# Patient Record
Sex: Female | Born: 1980 | Race: White | Hispanic: No | Smoking: Current every day smoker
Health system: Southern US, Community
[De-identification: ages and names within clinical notes are randomized; demographics above are authoritative.]

## PROBLEM LIST (undated history)

## (undated) DIAGNOSIS — U071 COVID-19: Secondary | ICD-10-CM

## (undated) DIAGNOSIS — F419 Anxiety disorder, unspecified: Secondary | ICD-10-CM

---

## 2004-08-18 ENCOUNTER — Emergency Department: Payer: Self-pay | Admitting: Emergency Medicine

## 2004-09-05 ENCOUNTER — Emergency Department: Payer: Self-pay | Admitting: Emergency Medicine

## 2004-10-02 ENCOUNTER — Emergency Department: Payer: Self-pay | Admitting: Emergency Medicine

## 2005-01-03 ENCOUNTER — Emergency Department: Payer: Self-pay | Admitting: Emergency Medicine

## 2005-01-31 ENCOUNTER — Emergency Department: Payer: Self-pay | Admitting: Emergency Medicine

## 2005-02-01 ENCOUNTER — Other Ambulatory Visit: Payer: Self-pay

## 2005-02-01 ENCOUNTER — Inpatient Hospital Stay: Payer: Self-pay | Admitting: Internal Medicine

## 2005-02-05 ENCOUNTER — Ambulatory Visit: Payer: Self-pay | Admitting: Endocrinology

## 2005-08-02 ENCOUNTER — Emergency Department: Payer: Self-pay | Admitting: Emergency Medicine

## 2005-08-02 ENCOUNTER — Other Ambulatory Visit: Payer: Self-pay

## 2005-12-13 ENCOUNTER — Emergency Department: Payer: Self-pay | Admitting: Emergency Medicine

## 2006-06-13 ENCOUNTER — Emergency Department: Payer: Self-pay | Admitting: Internal Medicine

## 2006-06-17 ENCOUNTER — Emergency Department: Payer: Self-pay | Admitting: Emergency Medicine

## 2006-06-18 ENCOUNTER — Emergency Department: Payer: Self-pay | Admitting: Emergency Medicine

## 2007-01-05 ENCOUNTER — Emergency Department: Payer: Self-pay | Admitting: Emergency Medicine

## 2007-03-13 ENCOUNTER — Emergency Department: Payer: Self-pay | Admitting: Emergency Medicine

## 2007-09-08 ENCOUNTER — Emergency Department: Payer: Self-pay | Admitting: Emergency Medicine

## 2007-11-16 ENCOUNTER — Emergency Department: Payer: Self-pay

## 2008-02-13 ENCOUNTER — Emergency Department: Payer: Self-pay | Admitting: Emergency Medicine

## 2008-02-16 ENCOUNTER — Ambulatory Visit: Payer: Self-pay | Admitting: Emergency Medicine

## 2008-06-09 ENCOUNTER — Emergency Department: Payer: Self-pay

## 2008-07-14 ENCOUNTER — Emergency Department: Payer: Self-pay | Admitting: Emergency Medicine

## 2008-08-23 ENCOUNTER — Emergency Department: Payer: Self-pay | Admitting: Internal Medicine

## 2008-10-14 ENCOUNTER — Encounter: Payer: Self-pay | Admitting: Obstetrics and Gynecology

## 2008-10-27 ENCOUNTER — Encounter: Payer: Self-pay | Admitting: Obstetrics and Gynecology

## 2008-11-08 ENCOUNTER — Encounter: Payer: Self-pay | Admitting: Obstetrics and Gynecology

## 2008-12-13 ENCOUNTER — Encounter: Payer: Self-pay | Admitting: Maternal & Fetal Medicine

## 2008-12-31 ENCOUNTER — Emergency Department: Payer: Self-pay | Admitting: Emergency Medicine

## 2009-04-15 ENCOUNTER — Inpatient Hospital Stay: Payer: Self-pay

## 2009-04-19 ENCOUNTER — Emergency Department: Payer: Self-pay | Admitting: Unknown Physician Specialty

## 2009-12-04 ENCOUNTER — Emergency Department: Payer: Self-pay | Admitting: Emergency Medicine

## 2009-12-07 ENCOUNTER — Emergency Department: Payer: Self-pay | Admitting: Unknown Physician Specialty

## 2010-02-22 ENCOUNTER — Emergency Department: Payer: Self-pay | Admitting: Emergency Medicine

## 2011-05-29 ENCOUNTER — Emergency Department: Payer: Self-pay | Admitting: *Deleted

## 2011-06-02 ENCOUNTER — Emergency Department (HOSPITAL_COMMUNITY)
Admission: EM | Admit: 2011-06-02 | Discharge: 2011-06-03 | Disposition: A | Discharge status: Home / Self Care | Payer: Medicaid Other | Attending: Emergency Medicine | Admitting: Emergency Medicine

## 2011-06-02 ENCOUNTER — Encounter (HOSPITAL_COMMUNITY): Payer: Self-pay | Admitting: *Deleted

## 2011-06-02 DIAGNOSIS — IMO0002 Reserved for concepts with insufficient information to code with codable children: Secondary | ICD-10-CM | POA: Insufficient documentation

## 2011-06-02 DIAGNOSIS — M25579 Pain in unspecified ankle and joints of unspecified foot: Secondary | ICD-10-CM | POA: Insufficient documentation

## 2011-06-02 DIAGNOSIS — M25572 Pain in left ankle and joints of left foot: Secondary | ICD-10-CM

## 2011-06-02 DIAGNOSIS — M79609 Pain in unspecified limb: Secondary | ICD-10-CM | POA: Insufficient documentation

## 2011-06-02 DIAGNOSIS — M5410 Radiculopathy, site unspecified: Secondary | ICD-10-CM

## 2011-06-02 DIAGNOSIS — M25476 Effusion, unspecified foot: Secondary | ICD-10-CM | POA: Insufficient documentation

## 2011-06-02 DIAGNOSIS — W208XXA Other cause of strike by thrown, projected or falling object, initial encounter: Secondary | ICD-10-CM | POA: Insufficient documentation

## 2011-06-02 DIAGNOSIS — M545 Low back pain, unspecified: Secondary | ICD-10-CM | POA: Insufficient documentation

## 2011-06-02 DIAGNOSIS — M25473 Effusion, unspecified ankle: Secondary | ICD-10-CM | POA: Insufficient documentation

## 2011-06-02 DIAGNOSIS — F17200 Nicotine dependence, unspecified, uncomplicated: Secondary | ICD-10-CM | POA: Insufficient documentation

## 2011-06-02 NOTE — ED Notes (Signed)
She is c/o lower back pain for 3-4 hours and the pain ges down into her buttocks.  She has been moving boxes for the past few days.  No previous history

## 2011-06-03 ENCOUNTER — Emergency Department (HOSPITAL_COMMUNITY): Payer: Medicaid Other

## 2011-06-03 LAB — URINALYSIS, ROUTINE W REFLEX MICROSCOPIC
Ketones, ur: NEGATIVE mg/dL
Nitrite: NEGATIVE
Protein, ur: 30 mg/dL — AB
Urobilinogen, UA: 0.2 mg/dL (ref 0.0–1.0)

## 2011-06-03 MED ORDER — OXYCODONE-ACETAMINOPHEN 5-325 MG PO TABS
1.0000 | ORAL_TABLET | Freq: Once | ORAL | Status: AC
  Administered 2011-06-03: 1 via ORAL
  Filled 2011-06-03: qty 1

## 2011-06-03 MED ORDER — PREDNISONE 10 MG PO TABS: ORAL_TABLET | ORAL | Status: DC

## 2011-06-03 MED ORDER — OXYCODONE-ACETAMINOPHEN 5-325 MG PO TABS: 1.0000 | ORAL_TABLET | ORAL | Status: AC | PRN

## 2011-06-03 NOTE — ED Provider Notes (Signed)
History     CSN: [id number]  Arrival date & time 06/02/11  2332   First MD Initiated Contact with Patient 06/03/11 [phone]      Chief Complaint  Patient presents with  . Back Pain    (Consider location/radiation/quality/duration/timing/severity/associated sxs/prior treatment) Patient is a 31 y.o. female presenting with back pain. The history is provided by the patient.  Back Pain  This is a new problem. The current episode started 12 to 24 hours ago. The problem occurs constantly. The problem has not changed since onset.The pain is associated with lifting heavy objects. The pain is present in the lumbar spine, sacro-iliac joint and gluteal region. The pain radiates to the right thigh. The pain is moderate. The symptoms are aggravated by bending, twisting and certain positions. The pain is the same all the time. Pertinent negatives include no chest pain, no fever, no numbness, no weight loss, no headaches, no abdominal pain, no bowel incontinence, no perianal numbness, no bladder incontinence, no dysuria, no pelvic pain, no leg pain, no paresthesias, no paresis, no tingling and no weakness. She has tried nothing for the symptoms.   Pt is moving and has been lifting heavy boxes. Went to lift a box of pots and pans and developed pain in low back which occasionally but not consistently radiates to R buttock and thigh. Unable to find a comfortable position. Pain is constant. No tx PTA.   Also c/o pain to L ankle; she dropped a mirror on the lateral ankle. Able to flex and bear wt but painful. Denies numbness, weakness, tingling.  History reviewed. No pertinent past medical history.  History reviewed. No pertinent past surgical history.  No family history on file.  History  Substance Use Topics  . Smoking status: Current Everyday Smoker  . Smokeless tobacco: Not on file  . Alcohol Use: No    OB History    Grav Para Term Preterm Abortions TAB SAB Ect Mult Living                  Review  of Systems  Constitutional: Negative for fever and weight loss.  Cardiovascular: Negative for chest pain.  Gastrointestinal: Negative for abdominal pain and bowel incontinence.  Genitourinary: Negative for bladder incontinence, dysuria and pelvic pain.  Musculoskeletal: Positive for back pain.  Neurological: Negative for tingling, weakness, numbness, headaches and paresthesias.  ROS as per HPI  Allergies  Review of patient's allergies indicates no known allergies.  Home Medications   Current Outpatient Rx  Name Route Sig Dispense Refill  . ESCITALOPRAM OXALATE 10 MG PO TABS Oral Take 10 mg by mouth daily.    . IBUPROFEN 200 MG PO TABS Oral Take 200-600 mg by mouth every 6 (six) hours as needed. pain    . LISDEXAMFETAMINE DIMESYLATE 30 MG PO CAPS Oral Take 30 mg by mouth every morning.      BP 121/75  Pulse 88  Temp(Src) 97.5 F (36.4 C) (Oral)  Resp 17  SpO2 100%  LMP 06/02/2011  Physical Exam  Nursing note and vitals reviewed. Constitutional: She appears well-developed and well-nourished. No distress.  HENT:  Head: Normocephalic and atraumatic.  Eyes: EOM are normal. Pupils are equal, round, and reactive to light.  Neck: Normal range of motion. Neck supple.  Cardiovascular: Normal rate, regular rhythm and normal heart sounds.   Pulmonary/Chest: Effort normal and breath sounds normal.  Abdominal: Soft. Bowel sounds are normal. There is no tenderness. There is no rebound and no guarding.  Musculoskeletal:  Spine: No palpable stepoff, crepitus, or gross deformity appreciated. No appreciable spasm of paravertebral muscles. Mildly ttp to midline to mid lumbar spine. No SI or gluteal tenderness.  L ankle: FROM, mild swelling with bruising over lat malleolus, no TTP   Neurological: She is alert.       Strength 5/5 on resisted knee flex/ext, foot dorsi/plantar flex. NVI with sensory grossly intact to lt touch. DP/PT pulses intact. 2+ achilles reflexes.  Skin: Skin is warm  and dry. No rash noted. She is not diaphoretic.  Psychiatric: She has a normal mood and affect.    ED Course  Procedures (including critical care time)  Labs Reviewed  URINALYSIS, ROUTINE W REFLEX MICROSCOPIC - Abnormal; Notable for the following:    Color, Urine RED (*) BIOCHEMICALS MAY BE AFFECTED BY COLOR   APPearance CLOUDY (*)    Hgb urine dipstick LARGE (*)    Protein, ur 30 (*)    Leukocytes, UA MODERATE (*)    All other components within normal limits  URINE MICROSCOPIC-ADD ON - Abnormal; Notable for the following:    Squamous Epithelial / LPF FEW (*)    All other components within normal limits  POCT PREGNANCY, URINE  pt currently menstruating  Dg Lumbar Spine Complete  06/03/2011  *RADIOLOGY REPORT*  Clinical Data: Low back pain  LUMBAR SPINE - COMPLETE 4+ VIEW  Comparison: None.  Findings: Five lumbar-type vertebral bodies.  Normal lumbar lordosis.  No evidence of fracture or dislocation.  Vertebral body heights and intervertebral disc spaces are maintained.  IMPRESSION: Normal lumbar spine radiographs.  Original Report Authenticated By: Charline Bills, M.D.   Dg Ankle Complete Left  06/03/2011  *RADIOLOGY REPORT*  Clinical Data: Mirror fell on left lateral ankle, pain/swelling  LEFT ANKLE COMPLETE - 3+ VIEW  Comparison: None.  Findings: No fracture or dislocation is seen.  The ankle mortise is intact.  The base of the fifth metatarsal is unremarkable.  Mild lateral ankle swelling.  Small plantar calcaneal enthesophyte.  IMPRESSION: No fracture or dislocation is seen.  Mild lateral ankle swelling.  Original Report Authenticated By: Charline Bills, M.D.     1. Radicular low back pain   2. Left ankle pain       MDM  Pt with low back pain. No "red flags" per hx. Neuro exam reassuring. LS XR nl. Suspect likely muscle strain. Will tx with pain meds, steroid. Instructed ice, gentle stretching. Return precautions discussed. Pt agreeable with plan.  Ankle XR  unremarkable.         Grant Fontana, [place] 06/06/11 1724  Grant Fontana, [place] 06/06/11 1725

## 2011-06-03 NOTE — ED Notes (Signed)
Pt states understanding of discharge instructions 

## 2011-06-03 NOTE — Discharge Instructions (Signed)
Your xrays did not show signs of any acute problems. This is likely an irritation of one of the nerves in your back. Please take the prednisone as prescribed along with the pain medication. Use ice or heat to the area if helpful. If you develop numbness or weakness in the legs, lose control of your bowels or bladder, or have any other worrisome symptoms, you need to return to the ER immediately.  Radicular Pain Radicular pain in either the arm or leg is usually from a bulging or herniated disk in the spine. A piece of the herniated disk may press against the nerves as the nerves exit the spine. This causes pain which is felt at the tips of the nerves down the arm or leg. Other causes of radicular pain may include:  Fractures.   Heart disease.   Cancer.   An abnormal and usually degenerative state of the nervous system or nerves (neuropathy).  Diagnosis may require CT or MRI scanning to determine the primary cause.  Nerves that start at the neck (nerve roots) may cause radicular pain in the outer shoulder and arm. It can spread down to the thumb and fingers. The symptoms vary depending on which nerve root has been affected. In most cases radicular pain improves with conservative treatment. Neck problems may require physical therapy, a neck collar, or cervical traction. Treatment may take many weeks, and surgery may be considered if the symptoms do not improve.  Conservative treatment is also recommended for sciatica. Sciatica causes pain to radiate from the lower back or buttock area down the leg into the foot. Often there is a history of back problems. Most patients with sciatica are better after 2 to 4 weeks of rest and other supportive care. Short term bed rest can reduce the disk pressure considerably. Sitting, however, is not a good position since this increases the pressure on the disk. You should avoid bending, lifting, and all other activities which make the problem worse. Traction can be used  in severe cases. Surgery is usually reserved for patients who do not improve within the first months of treatment. Only take over-the-counter or prescription medicines for pain, discomfort, or fever as directed by your caregiver. Narcotics and muscle relaxants may help by relieving more severe pain and spasm and by providing mild sedation. Cold or massage can give significant relief. Spinal manipulation is not recommended. It can increase the degree of disc protrusion. Epidural steroid injections are often effective treatment for radicular pain. These injections deliver medicine to the spinal nerve in the space between the protective covering of the spinal cord and back bones (vertebrae). Your caregiver can give you more information about steroid injections. These injections are most effective when given within two weeks of the onset of pain.  You should see your caregiver for follow up care as recommended. A program for neck and back injury rehabilitation with stretching and strengthening exercises is an important part of management.  SEEK IMMEDIATE MEDICAL CARE IF:  You develop increased pain, weakness, or numbness in your arm or leg.   You develop difficulty with bladder or bowel control.   You develop abdominal pain.  Document Released: 03/22/2004 Document Revised: 02/01/2011 Document Reviewed: 06/07/2008 [facility] Patient Information 2012 [place], [place].

## 2011-06-06 NOTE — ED Provider Notes (Signed)
Medical screening examination/treatment/procedure(s) were performed by non-physician practitioner and as supervising physician I was immediately available for consultation/collaboration.  Gerhard Munch, MD 06/06/11 [phone]

## 2011-09-22 ENCOUNTER — Encounter (HOSPITAL_COMMUNITY): Payer: Self-pay | Admitting: *Deleted

## 2011-09-22 ENCOUNTER — Emergency Department (HOSPITAL_COMMUNITY)
Admission: EM | Admit: 2011-09-22 | Discharge: 2011-09-22 | Discharge status: Against Medical Advice | Payer: Medicaid Other | Attending: Emergency Medicine | Admitting: Emergency Medicine

## 2011-09-22 DIAGNOSIS — R51 Headache: Secondary | ICD-10-CM | POA: Insufficient documentation

## 2011-09-22 HISTORY — DX: Anxiety disorder, unspecified: F41.9

## 2011-09-22 NOTE — ED Notes (Signed)
Patient not in waiting area.

## 2011-09-22 NOTE — ED Notes (Signed)
Pt states that she fell down approx. steps. Pt states she was carrying a box and tripped. Pt states that she has a HA, neck pain. Pt states generalized pain from fall. Pt alert and oriented ambulatory. Pt does have small area on upper lip from fall, pt did hit head but denies LOC>

## 2011-09-22 NOTE — ED Notes (Signed)
Patient not in waiting area at this time

## 2011-09-24 ENCOUNTER — Emergency Department: Payer: Self-pay | Admitting: Emergency Medicine

## 2011-11-26 ENCOUNTER — Emergency Department: Payer: Self-pay | Admitting: Emergency Medicine

## 2011-12-15 ENCOUNTER — Emergency Department: Payer: Self-pay | Admitting: Emergency Medicine

## 2011-12-15 LAB — TSH: Thyroid Stimulating Horm: 9.85 u[IU]/mL — ABNORMAL HIGH

## 2011-12-15 LAB — COMPREHENSIVE METABOLIC PANEL
Anion Gap: 8 (ref 7–16)
BUN: 10 mg/dL (ref 7–18)
Bilirubin,Total: 0.9 mg/dL (ref 0.2–1.0)
Chloride: 110 mmol/L — ABNORMAL HIGH (ref 98–107)
Co2: 24 mmol/L (ref 21–32)
Creatinine: 0.81 mg/dL (ref 0.60–1.30)
EGFR (African American): >60
Osmolality: 282 (ref 275–301)
Potassium: 3.7 mmol/L (ref 3.5–5.1)
Sodium: 142 mmol/L (ref 136–145)
Total Protein: 7.3 g/dL (ref 6.4–8.2)

## 2011-12-15 LAB — URINALYSIS, COMPLETE
Bilirubin,UR: NEGATIVE
Nitrite: NEGATIVE
Ph: 6 (ref 4.5–8.0)
Protein: NEGATIVE

## 2011-12-15 LAB — DRUG SCREEN, URINE
Barbiturates, Ur Screen: NEGATIVE (ref ?–200)
Cannabinoid 50 Ng, Ur ~~LOC~~: POSITIVE (ref ?–50)
Cocaine Metabolite,Ur ~~LOC~~: NEGATIVE (ref ?–300)
MDMA (Ecstasy)Ur Screen: NEGATIVE (ref ?–500)
Phencyclidine (PCP) Ur S: NEGATIVE (ref ?–25)

## 2011-12-15 LAB — CBC
HGB: 12.7 g/dL (ref 12.0–16.0)
RBC: 3.98 10*6/uL (ref 3.80–5.20)

## 2011-12-15 LAB — ETHANOL
Ethanol %: <0.003 % (ref 0.000–0.080)
Ethanol: <3 mg/dL

## 2012-02-06 ENCOUNTER — Emergency Department: Payer: Self-pay | Admitting: Emergency Medicine

## 2012-07-09 ENCOUNTER — Emergency Department: Payer: Self-pay | Admitting: Internal Medicine

## 2012-07-09 LAB — CBC
MCHC: 35 g/dL (ref 32.0–36.0)
Platelet: 321 10*3/uL (ref 150–440)
RBC: 4.28 10*6/uL (ref 3.80–5.20)
WBC: 11.2 10*3/uL — ABNORMAL HIGH (ref 3.6–11.0)

## 2012-07-09 LAB — COMPREHENSIVE METABOLIC PANEL
Alkaline Phosphatase: 70 U/L (ref 50–136)
Co2: 28 mmol/L (ref 21–32)
Creatinine: 0.75 mg/dL (ref 0.60–1.30)
EGFR (African American): >60
EGFR (Non-African Amer.): >60
Potassium: 3.8 mmol/L (ref 3.5–5.1)
SGPT (ALT): 14 U/L (ref 12–78)
Total Protein: 8.2 g/dL (ref 6.4–8.2)

## 2012-07-09 LAB — LIPASE, BLOOD: Lipase: 113 U/L (ref 73–393)

## 2012-07-14 ENCOUNTER — Ambulatory Visit: Payer: Self-pay | Admitting: Surgery

## 2012-07-14 LAB — LIPASE, BLOOD: Lipase: 63 U/L — ABNORMAL LOW (ref 73–393)

## 2012-07-15 LAB — PATHOLOGY REPORT

## 2013-05-22 DIAGNOSIS — F1911 Other psychoactive substance abuse, in remission: Secondary | ICD-10-CM | POA: Insufficient documentation

## 2013-07-07 ENCOUNTER — Emergency Department: Payer: Self-pay | Admitting: Emergency Medicine

## 2013-12-03 ENCOUNTER — Emergency Department: Payer: Self-pay | Admitting: Emergency Medicine

## 2014-03-26 ENCOUNTER — Emergency Department: Payer: Self-pay | Admitting: Emergency Medicine

## 2014-03-26 LAB — CBC
HCT: 41.9 % (ref 35.0–47.0)
HGB: 14.1 g/dL (ref 12.0–16.0)
MCH: 31.6 pg (ref 26.0–34.0)
MCHC: 33.6 g/dL (ref 32.0–36.0)
MCV: 94 fL (ref 80–100)
Platelet: 311 10*3/uL (ref 150–440)
RBC: 4.47 10*6/uL (ref 3.80–5.20)
RDW: 12.6 % (ref 11.5–14.5)
WBC: 12.8 10*3/uL — ABNORMAL HIGH (ref 3.6–11.0)

## 2014-03-26 LAB — BASIC METABOLIC PANEL
ANION GAP: 6 — AB (ref 7–16)
BUN: 12 mg/dL (ref 7–18)
CHLORIDE: 108 mmol/L — AB (ref 98–107)
CO2: 25 mmol/L (ref 21–32)
CREATININE: 0.82 mg/dL (ref 0.60–1.30)
Calcium, Total: 8.9 mg/dL (ref 8.5–10.1)
EGFR (African American): >60
EGFR (Non-African Amer.): >60
Glucose: 97 mg/dL (ref 65–99)
Osmolality: 277 (ref 275–301)
POTASSIUM: 3.6 mmol/L (ref 3.5–5.1)
Sodium: 139 mmol/L (ref 136–145)

## 2014-03-26 LAB — PREGNANCY, URINE: Pregnancy Test, Urine: NEGATIVE m[IU]/mL

## 2014-03-26 LAB — TROPONIN I

## 2014-03-26 LAB — PRO B NATRIURETIC PEPTIDE: B-Type Natriuretic Peptide: 17 pg/mL (ref 0–125)

## 2014-03-27 LAB — TROPONIN I: Troponin-I: <0.02 ng/mL

## 2014-03-27 LAB — HEPATIC FUNCTION PANEL A (ARMC)
Albumin: 3.4 g/dL (ref 3.4–5.0)
Alkaline Phosphatase: 71 U/L (ref 46–116)
BILIRUBIN DIRECT: 0.1 mg/dL (ref 0.0–0.2)
Bilirubin,Total: 0.2 mg/dL (ref 0.2–1.0)
SGOT(AST): 18 U/L (ref 15–37)
SGPT (ALT): 20 U/L (ref 14–63)
Total Protein: 7.3 g/dL (ref 6.4–8.2)

## 2014-03-27 LAB — LIPASE, BLOOD: LIPASE: 97 U/L (ref 73–393)

## 2014-06-18 NOTE — Op Note (Signed)
PATIENT NAME:  [name], Brittany B MR#:  [id number] DATE OF BIRTH:  02-06-1981  DATE OF PROCEDURE:  07/14/2012  PREOPERATIVE DIAGNOSIS: Acute cholecystitis.   POSTOPERATIVE DIAGNOSIS: Symptomatic cholelithiasis.   PROCEDURE PERFORMED: Laparoscopic cholecystectomy.   SURGEON: Ida Roguehristopher Abrar Koone, MD  ESTIMATED BLOOD LOSS: 10 mL.   COMPLICATIONS: None.   SPECIMENS: Gallbladder.   INDICATION FOR SURGERY: Brittany Keller is a pleasant 34 year old female with history of recurrent right upper quadrant pain. She was noted to have mobile gallstones as well as leukocytosis during her most recent episode. We thought she would benefit from a cholecystectomy. She was thus brought to the operating room suite for laparoscopic cholecystectomy.   DETAILS OF PROCEDURE: Brittany Keller was brought to the operating room suite. After informed consent was obtained, she was induced, endotracheal tube was placed, and general anesthesia was administered. Her abdomen was then prepped and draped in standard surgical fashion. A timeout was then performed correctly identifying patient name, operative site and procedure to be performed. A supraumbilical incision was made. This was deepened down to the fascia. The fascia was incised. The peritoneum was entered. Two stay sutures were placed through the fascia.  A Hassan trocar was placed through the fasciotomy. The gallbladder was evaluated. It was noted to not be acutely inflamed. An 11 mm epigastric port was placed and two 5 mm subcostal ports were placed at the mid clavicular line and anterior axillary line. The gallbladder was then lifted up over the dome of the liver. The cystic duct and cystic artery were carefully dissected out. These were clipped 3 times and ligated. The gallbladder was then taken with electrocautery off the dome of the liver. The gallbladder was then taken out with an Endo Catch bag. The gallbladder fossa was examined.  The gallbladder fossa was noted  to be hemostatic. The abdomen was irrigated then with a liter and a half of normal saline. The gallbladder fossa was examined again and noted to be hemostatic. The trocars then were removed under direct visualization. The supraumbilical trocar site was closed using the previously placed stay sutures. The skin was then closed with interrupted 4-0 Monocryl deep dermal sutures. Steri-Strips, Telfa gauze, and Tegaderm were then used to complete the dressing. The patient was then awoken, extubated, and brought to the postanesthesia care unit. There were no immediate complications. Needle, sponge, and instrument counts were correct at the end of the procedure.  ____________________________ Si Raiderhristopher A. Jerra Huckeby, MD cal:sb D: 07/14/2012 12:55:41 ET T: 07/14/2012 14:12:09 ET JOB#: [id number]  cc: Cristal Deerhristopher A. Daschel Roughton, MD, <Dictator> Jarvis NewcomerHRISTOPHER A Brayant Dorr MD ELECTRONICALLY SIGNED 07/17/2012 12:57

## 2015-03-09 IMAGING — US ABDOMEN ULTRASOUND LIMITED
1 series · 14 of 25 positions shown · non-contrast
Comparison: none

REASON FOR EXAM: +Murphy sign
COMMENTS:   Body Site: GB and Fossa, CBD, Head of Pancreas

PROCEDURE:     US  - US ABDOMEN LIMITED SURVEY  - July 09, 2012  [DATE]
RESULT:     Right upper quadrant abdominal ultrasound dated 07/09/2012.

[Series 1: abdomen ultrasound limited · 0.30mm/px · 14 of 48 slices shown]
[im 1/48]
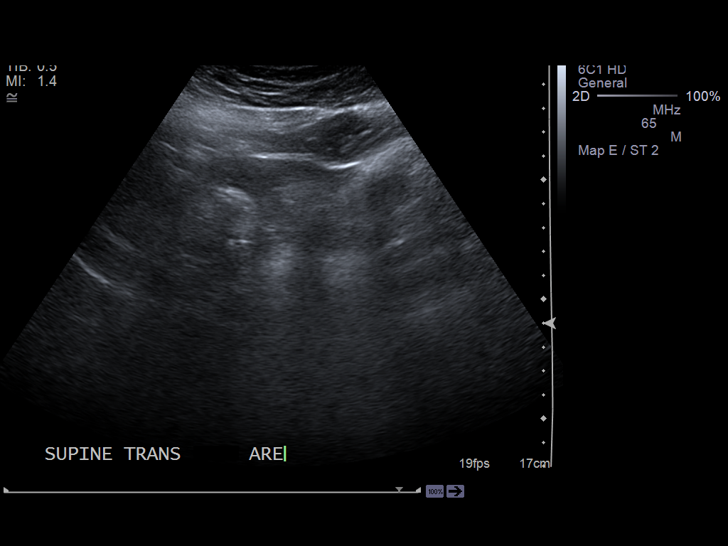
[im 4/48]
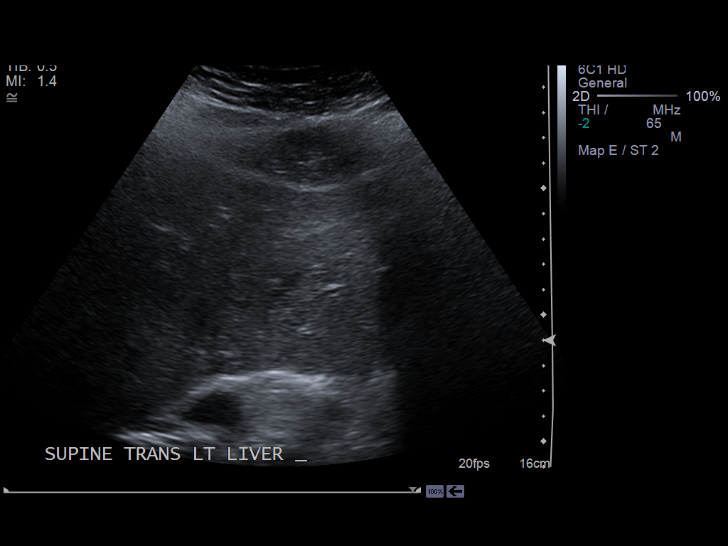
[im 8/48]
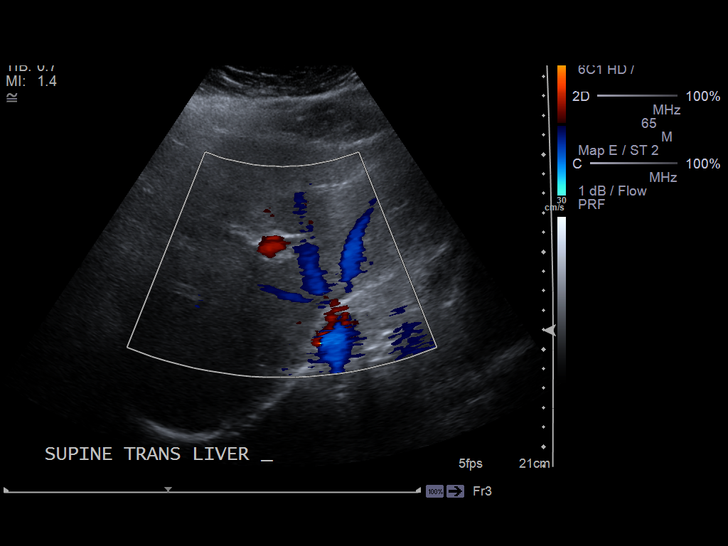
[im 12/48]
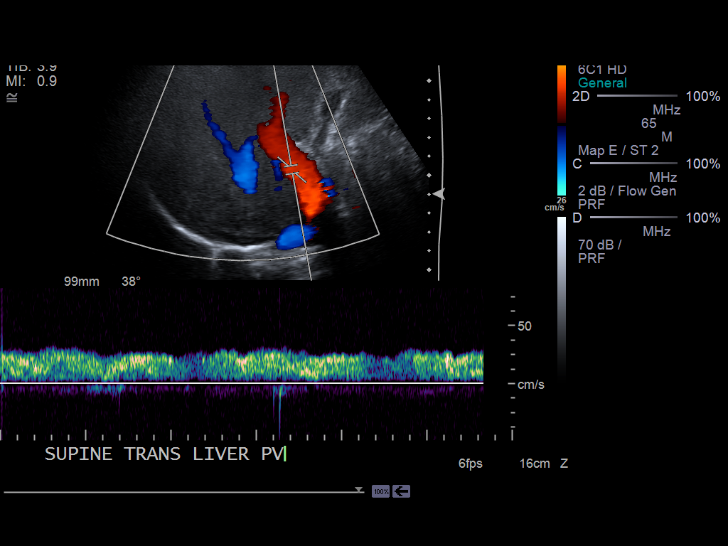
[im 16/48]
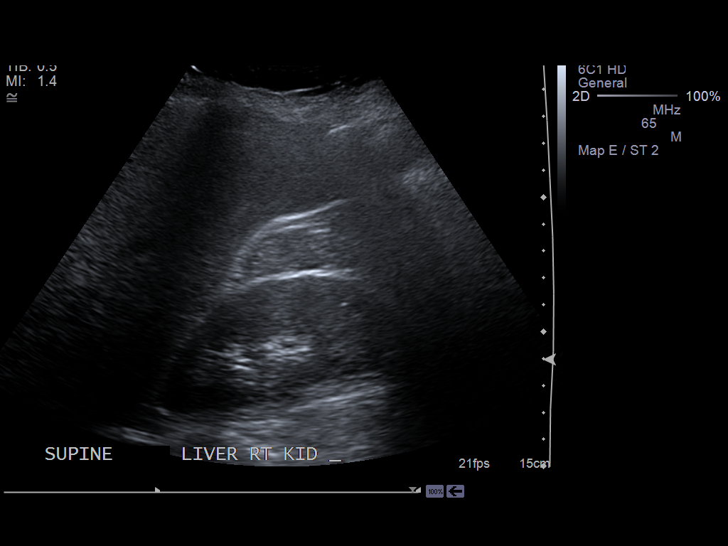
[im 18/48]
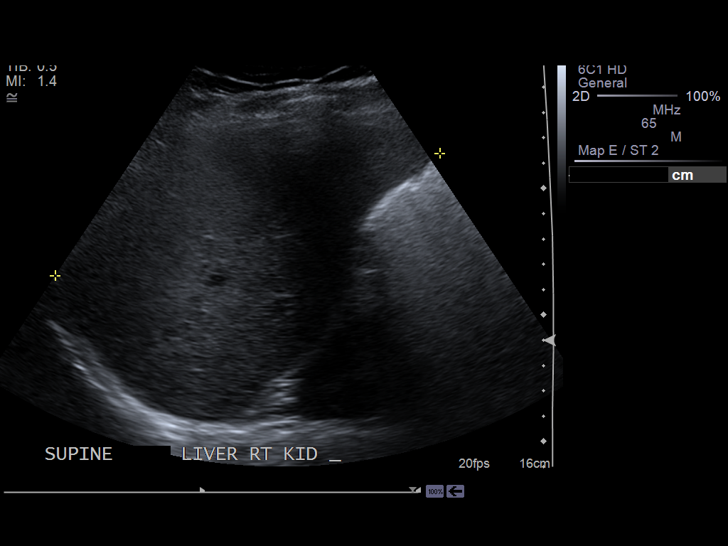
[im 22/48]
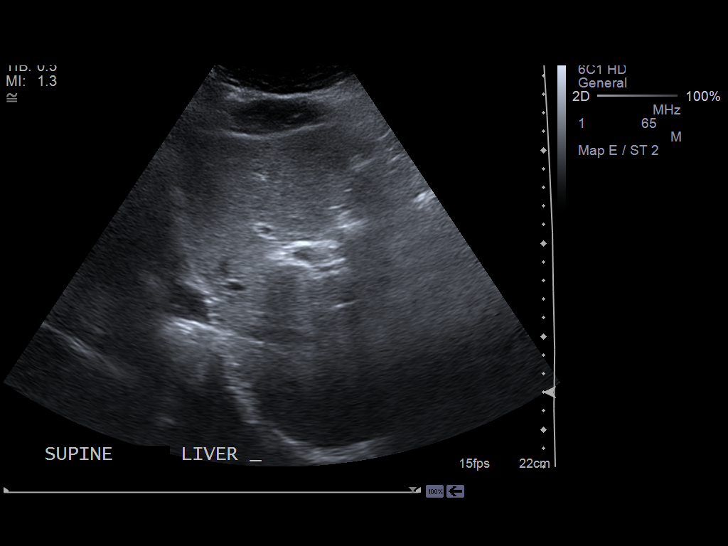
[im 26/48]
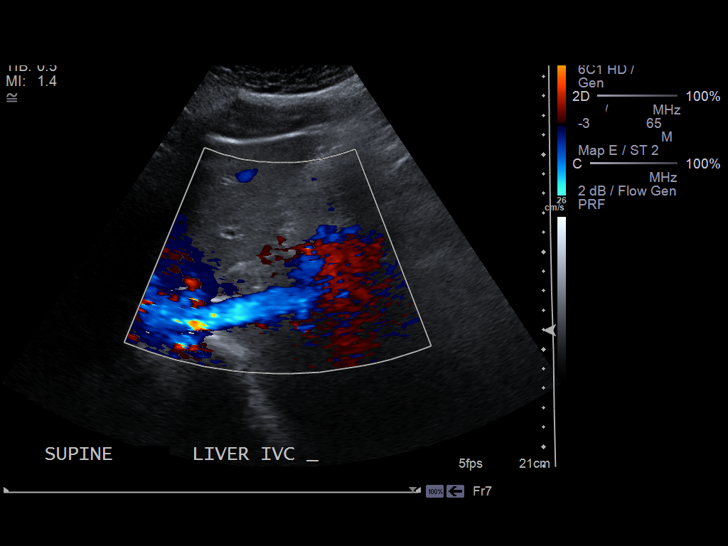
[im 30/48]
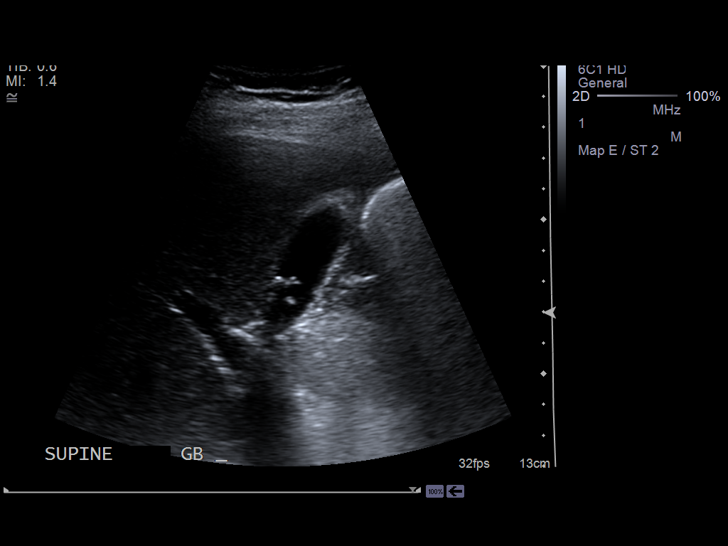
[im 32/48]
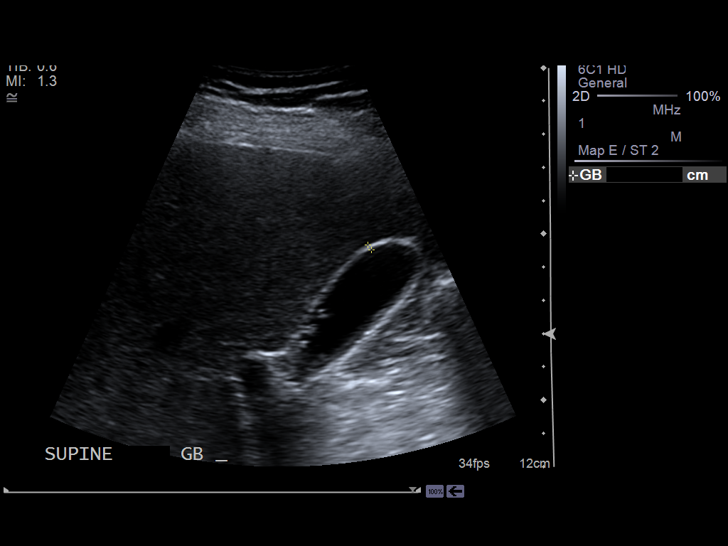
[im 36/48]
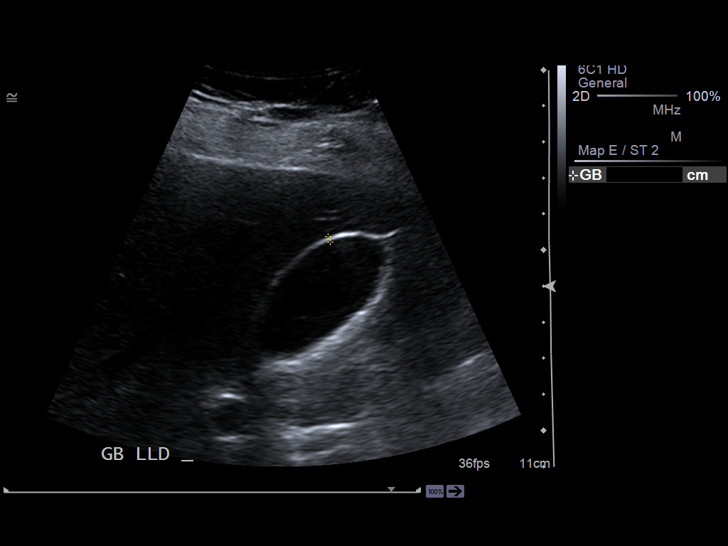
[im 40/48]
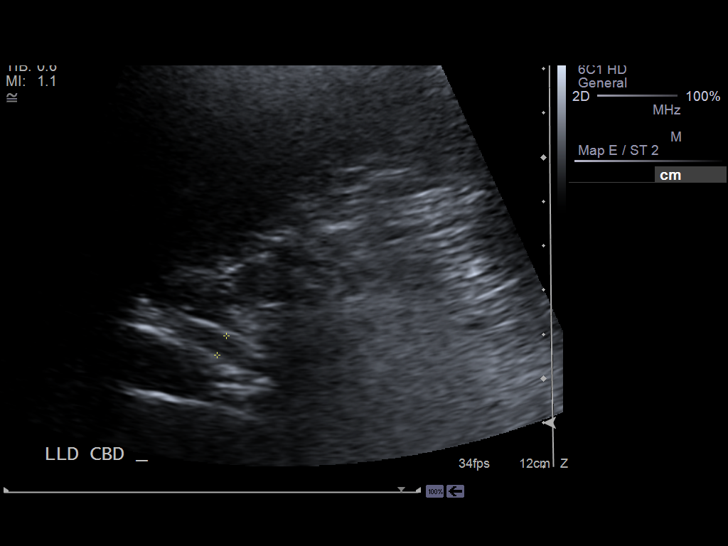
[im 44/48]
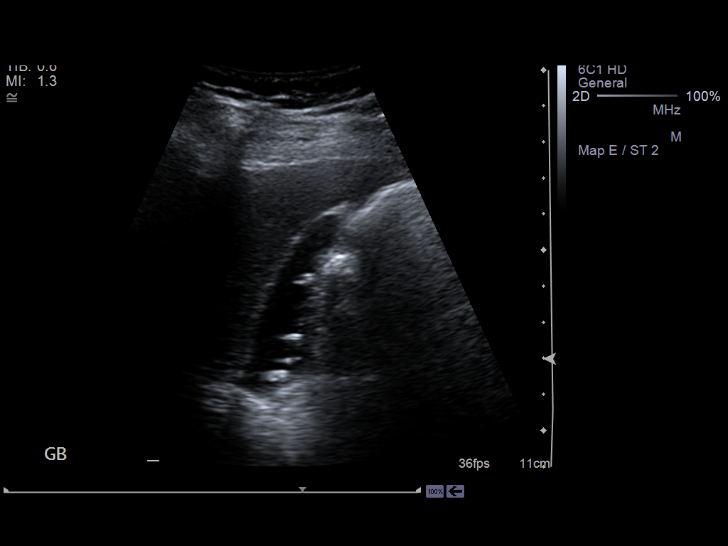
[im 48/48]
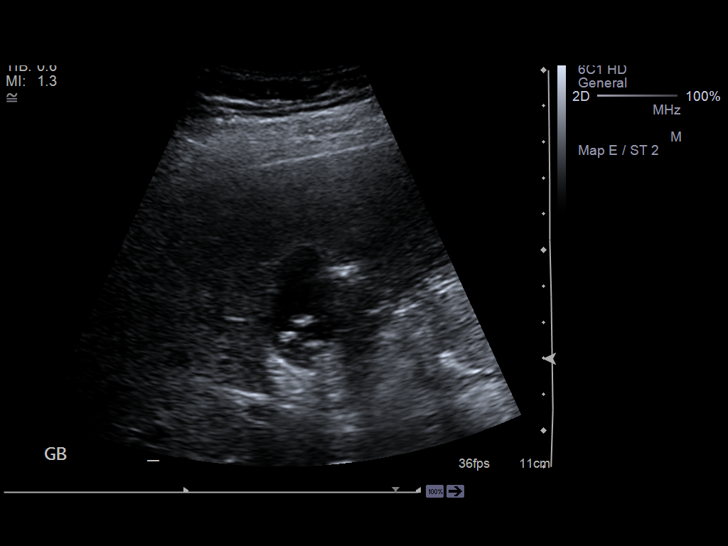

[14 of 25 positions shown; findings below may reference images not displayed]

FINDINGS: The liver demonstrates a homogeneous echotexture. Hepato- pedal
flow is identified within the portal vein. Pancreas is nonvisualized.

Mobile gallstones identified within the gallbladder. A sonographic Murphy's
sign was elicited. There is no evidence of pericholecystic fluid,
gallbladder wall thickening, nor intra- or extrahepatic biliary ductal
dilatation. The gallbladder wall measures 1.8 mm in thickness and the common
bile duct 4.8 mm in diameter.
IMPRESSION: Mobile gallstones and a positive sonographic Murphy's sign.
These areas are equivocal in reference to the diagnosis of cholecystitis and
clinical correlation recommended.

## 2016-03-06 IMAGING — CR RIGHT HAND - COMPLETE 3+ VIEW
1 series · 3 of 3 positions shown · non-contrast
Comparison: None.

CLINICAL DATA: Jammed right thumb.

EXAM:
RIGHT HAND - COMPLETE 3+ VIEW

[Series 1: pa · 0.17mm/px · 3 of 3 slices shown]
[im 1/3]
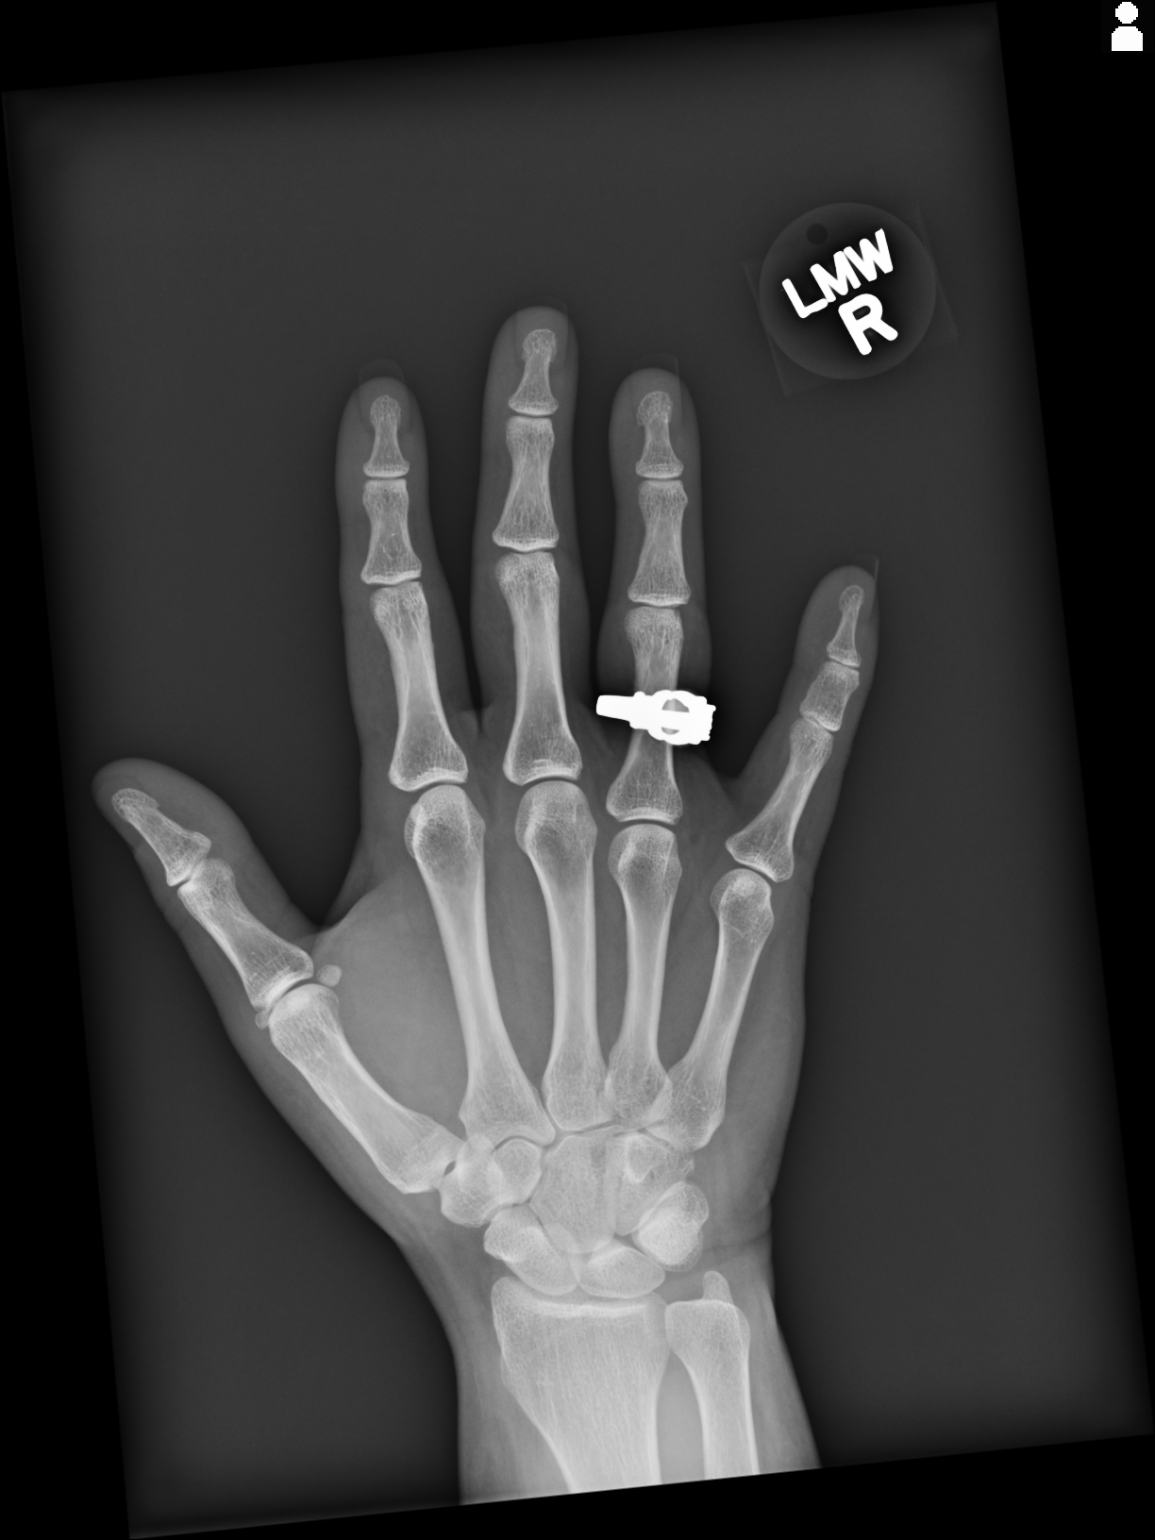
[im 2/3]
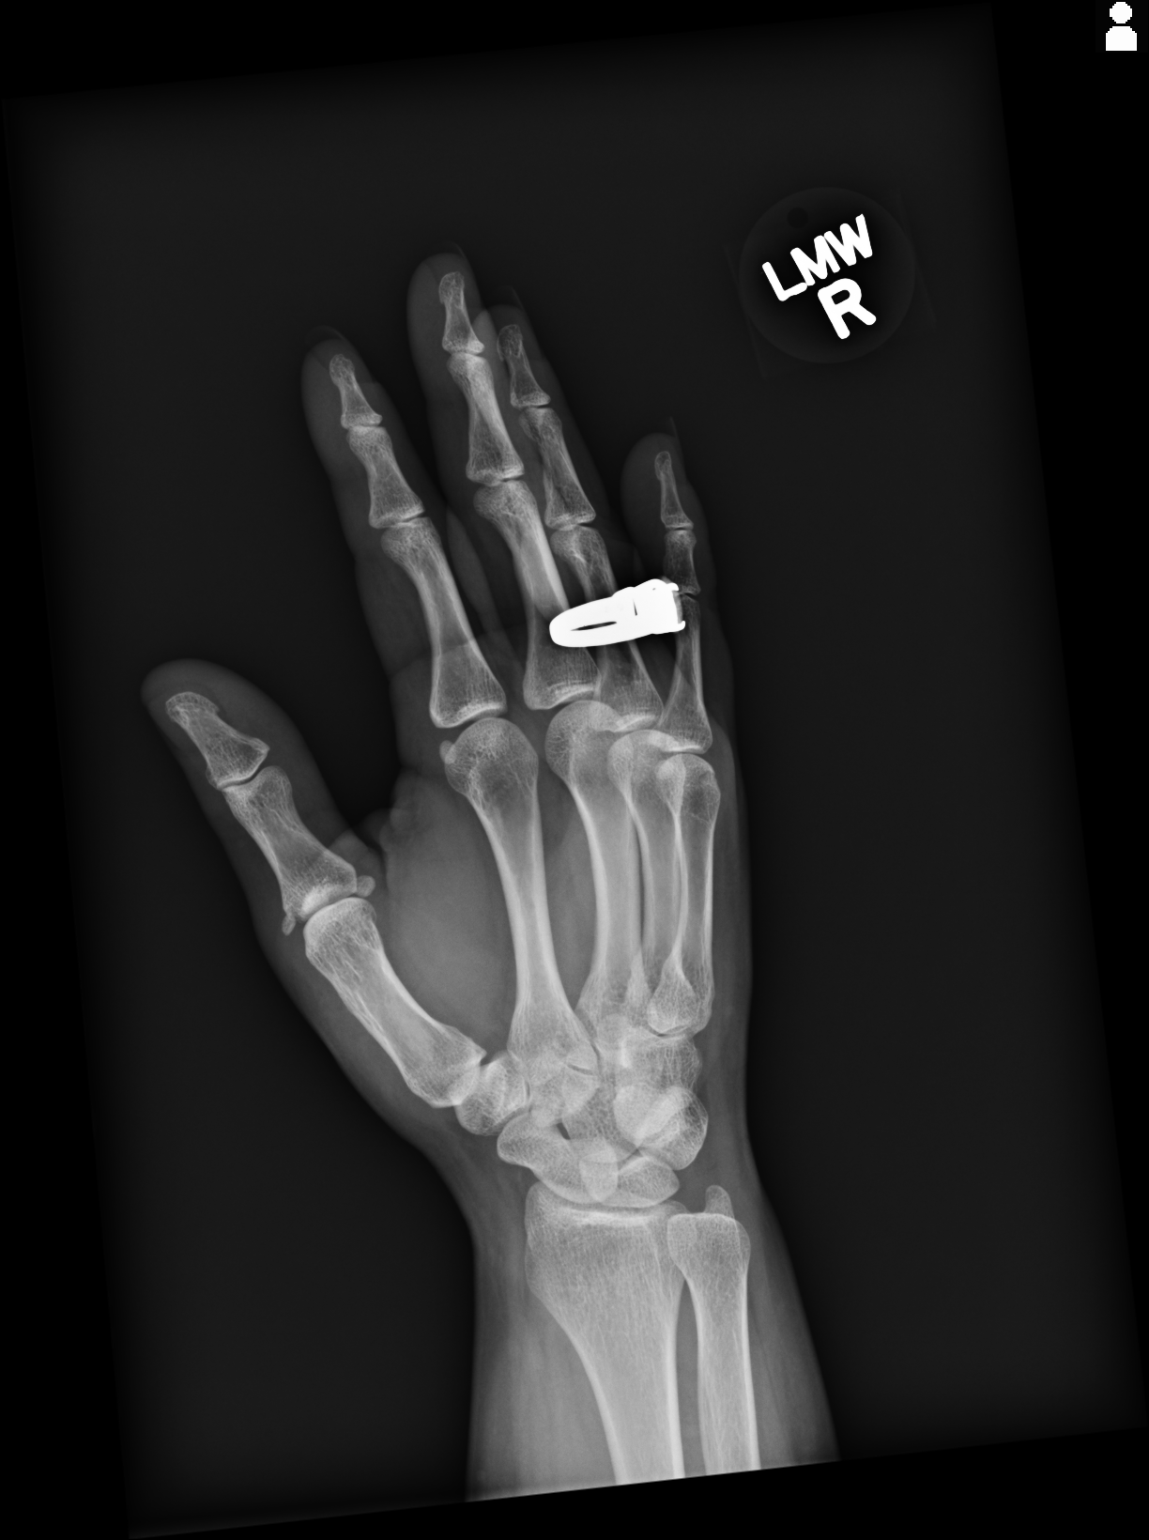
[im 3/3]
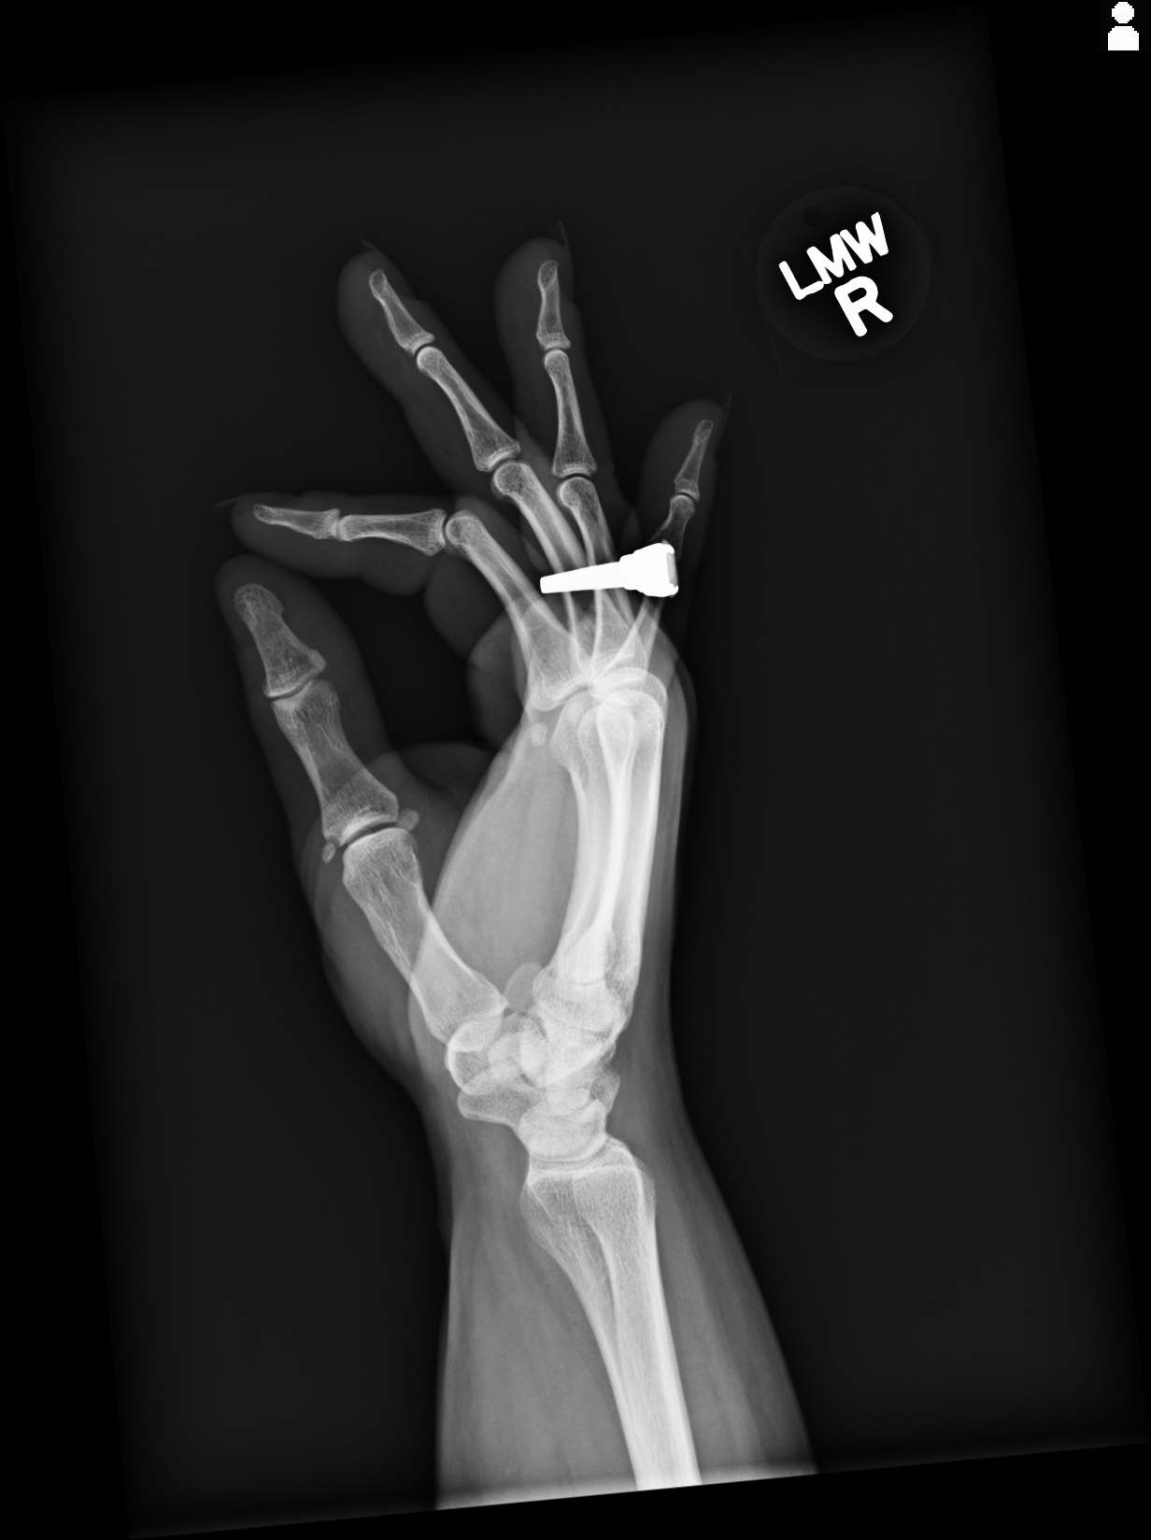

[3 of 3 positions shown; findings below may reference images not displayed]

FINDINGS: No acute fracture or dislocation. Osseous density at the base of the
right first proximal phalanx is well corticated, and most compatible
with a chronic finding. Joint spaces are maintained. No soft tissue
abnormality. Osseous mineralization is normal.
IMPRESSION: 1. No acute fracture dislocation.
2. Corticated osseous density at the base of the right first
proximal phalanx. The appearance of this fragment is most consistent
with a chronic finding.

## 2016-08-02 IMAGING — CR DG CHEST 2V
1 series · 2 of 2 positions shown · non-contrast
Comparison: Two-view chest 04/19/2009.

CLINICAL DATA: Cough. Left-sided chest pain extending into the left
upper back.

EXAM:
CHEST  2 VIEW

[Series 1: dxr chest pa (or ap) and lateral · 0.14mm/px · 2 of 2 slices shown]
[im 1/2]
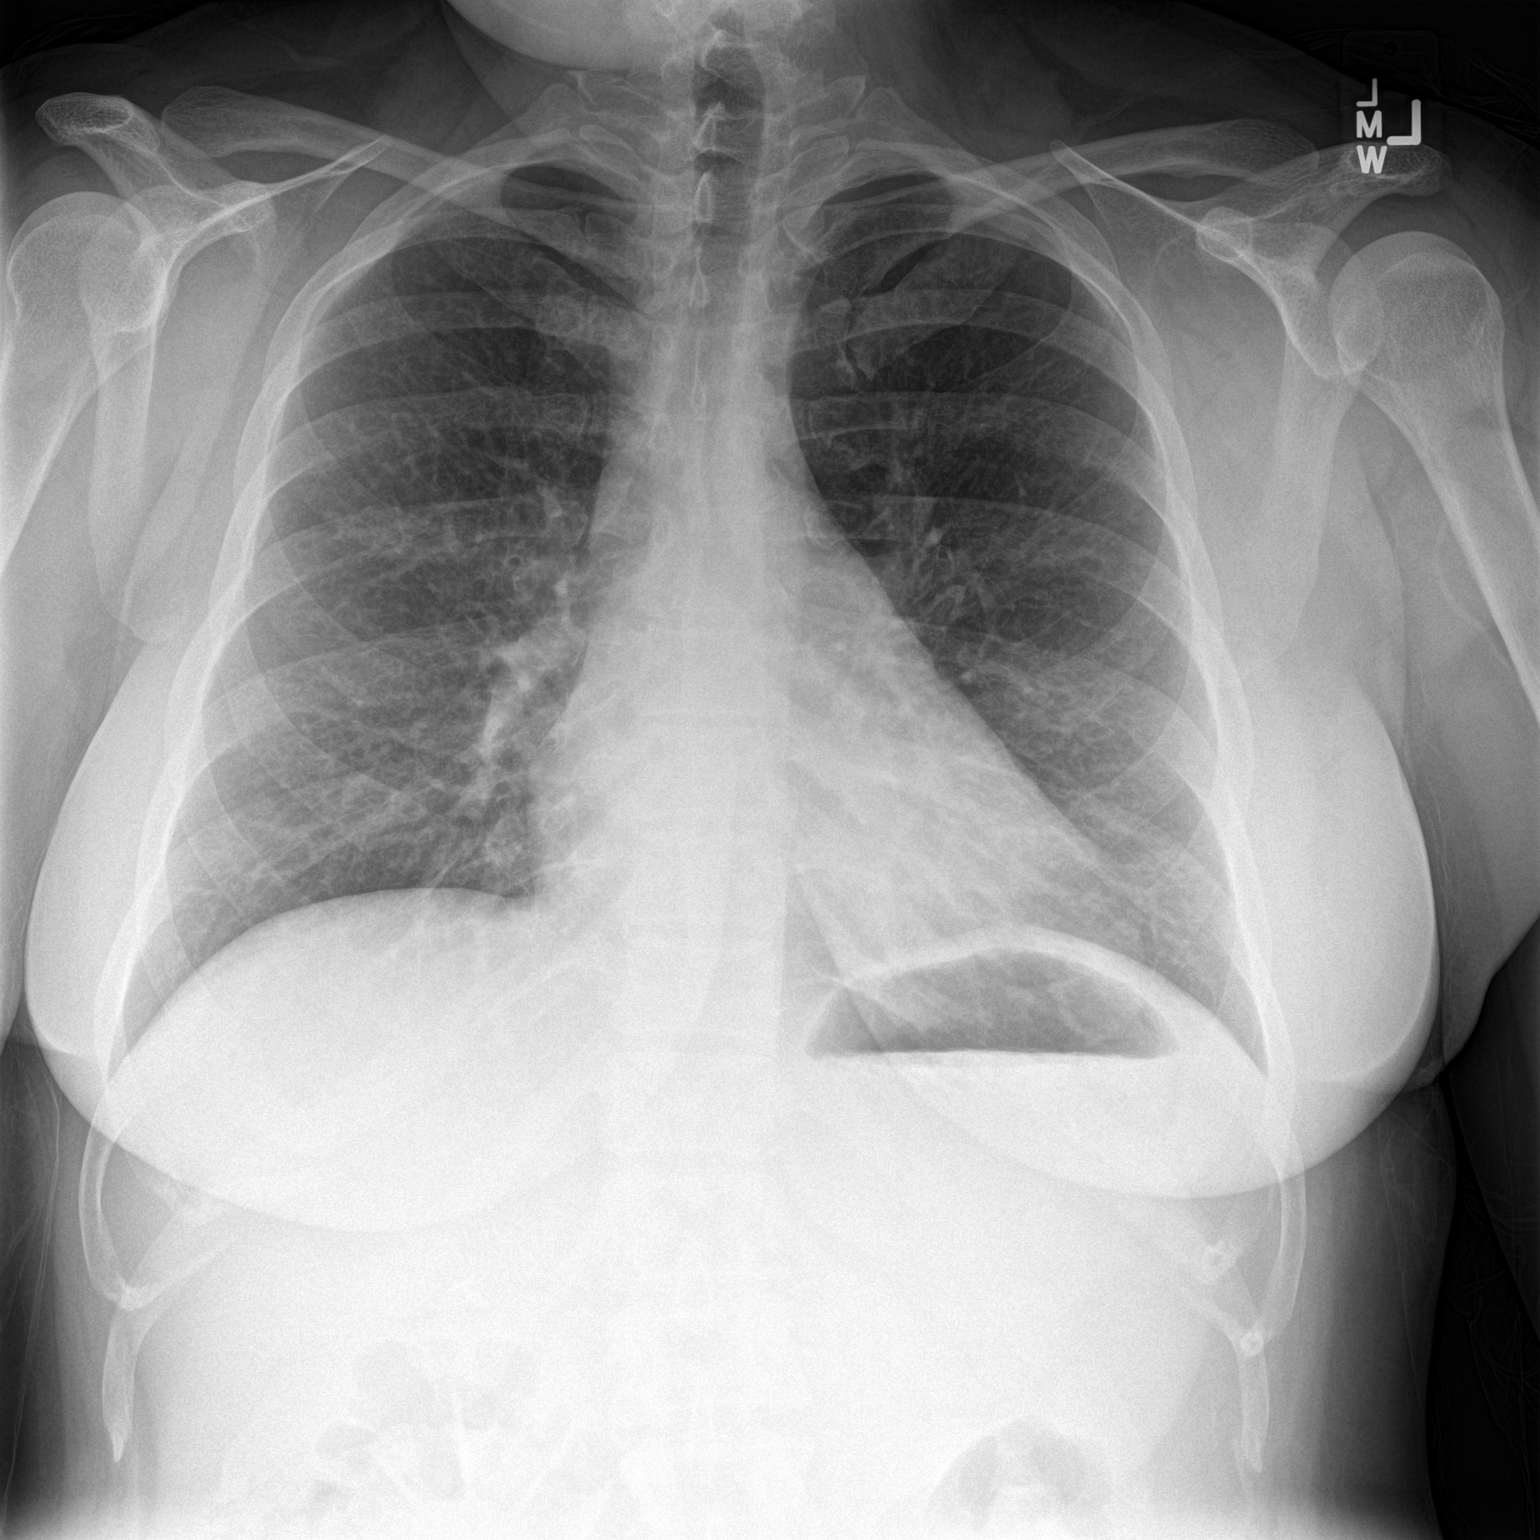
[im 2/2]
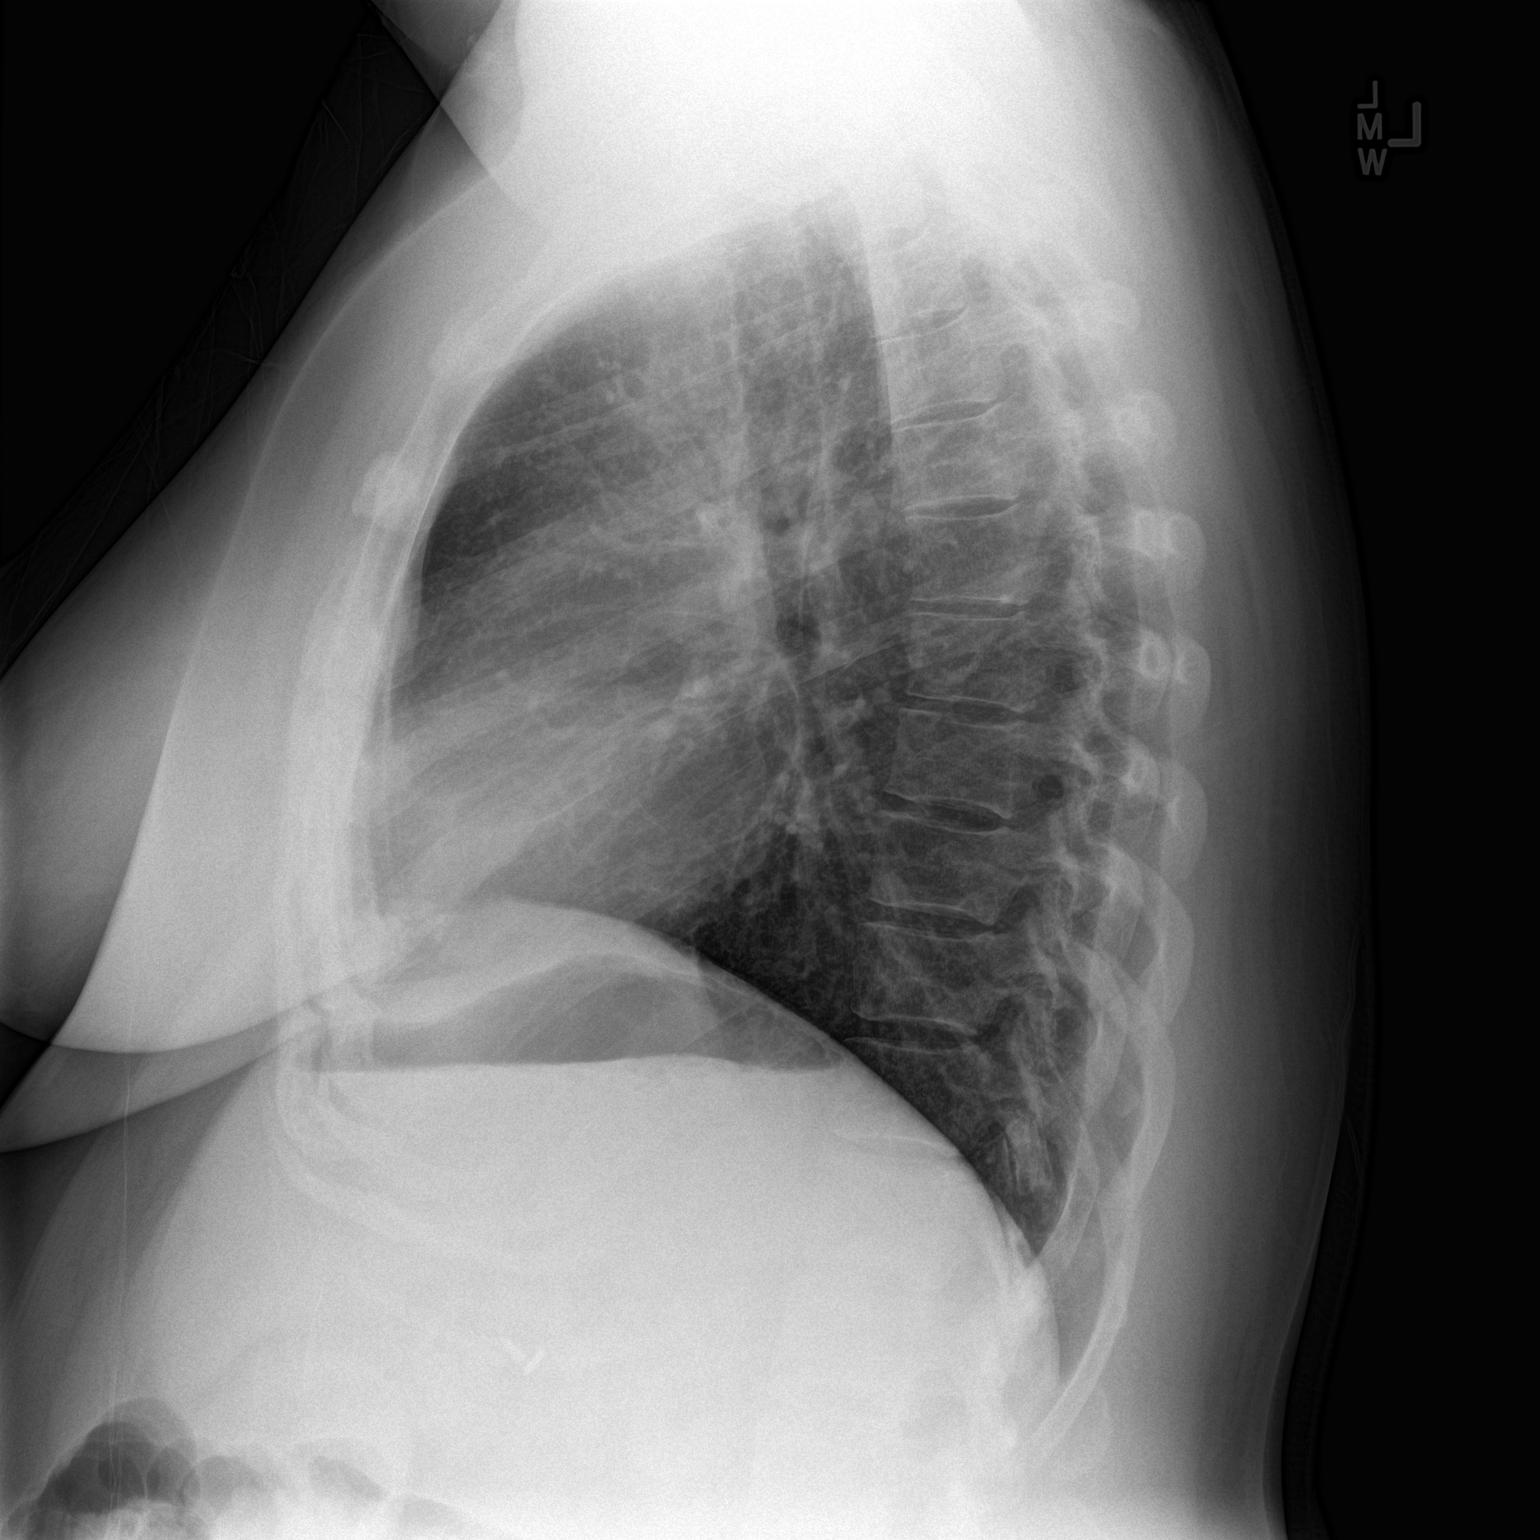

[2 of 2 positions shown; findings below may reference images not displayed]

FINDINGS: The heart size is normal. Mild chronic interstitial coarsening is
evident. The lungs are otherwise clear. No focal airspace
consolidation is evident. The visualized soft tissues and bony
thorax are unremarkable.
IMPRESSION: 1. No acute cardiopulmonary disease or significant interval change.
2. Stable chronic interstitial coarsening.

## 2016-08-26 ENCOUNTER — Emergency Department
Admission: EM | Admit: 2016-08-26 | Discharge: 2016-08-26 | Disposition: A | Discharge status: Home / Self Care | Payer: Medicaid Other | Attending: Student in an Organized Health Care Education/Training Program | Admitting: Student in an Organized Health Care Education/Training Program

## 2016-08-26 DIAGNOSIS — Z79899 Other long term (current) drug therapy: Secondary | ICD-10-CM | POA: Insufficient documentation

## 2016-08-26 DIAGNOSIS — L02412 Cutaneous abscess of left axilla: Secondary | ICD-10-CM

## 2016-08-26 DIAGNOSIS — F17200 Nicotine dependence, unspecified, uncomplicated: Secondary | ICD-10-CM | POA: Insufficient documentation

## 2016-08-26 MED ORDER — HYDROCODONE-ACETAMINOPHEN 5-325 MG PO TABS: 1.0000 | ORAL_TABLET | Freq: Four times a day (QID) | ORAL | 0 refills | Status: DC | PRN

## 2016-08-26 MED ORDER — CLINDAMYCIN HCL 300 MG PO CAPS
300.0000 mg | ORAL_CAPSULE | Freq: Three times a day (TID) | ORAL | 0 refills | Status: AC
Start: 2016-08-26 — End: 2016-09-05

## 2016-08-26 MED ORDER — ONDANSETRON 4 MG PO TBDP: 4.0000 mg | ORAL_TABLET | Freq: Three times a day (TID) | ORAL | 0 refills | Status: DC | PRN

## 2016-08-26 NOTE — ED Provider Notes (Signed)
[facility] Emergency Department Provider Note  ____________________________________________  Time seen: Approximately 5:37 PM  I have reviewed the triage vital signs and the nursing notes.   HISTORY  Chief Complaint Abscess    HPI Brittany Keller is a 36 y.o. female who presents emergency department complaining of an abscess under her left armpit. Patient reports that has been present for a couple days. It is in size and redness. Today, it burst and drained out pustular material. Patient reports that it has been slowly oozing a little bit of blood but no pus at this time. She denies any fevers or chills, abdominal pain, nausea vomiting. No recent antibiotic use. No history of recurrent skin infections. No other complaints at this time.   Past Medical History:  Diagnosis Date  . Anxiety     There are no active problems to display for this patient.   No past surgical history on file.  Prior to Admission medications   Medication Sig Start Date End Date Taking? Authorizing Provider  clindamycin (CLEOCIN) 300 MG capsule Take 1 capsule (300 mg total) by mouth 3 (three) times daily. 08/26/16 09/05/16  Cuthriell, Delorise RoyalsJonathan D, PA-C  escitalopram (LEXAPRO) 10 MG tablet Take 10 mg by mouth daily.    [provider]  HYDROcodone-acetaminophen (NORCO/VICODIN) 5-325 MG tablet Take 1 tablet by mouth every 6 (six) hours as needed for moderate pain. 08/26/16   Cuthriell, Delorise RoyalsJonathan D, PA-C  ibuprofen (ADVIL,MOTRIN) 200 MG tablet Take 200-600 mg by mouth every 6 (six) hours as needed. pain    [provider]  lisdexamfetamine (VYVANSE) 30 MG capsule Take 30 mg by mouth every morning.    [provider]  ondansetron (ZOFRAN-ODT) 4 MG disintegrating tablet Take 1 tablet (4 mg total) by mouth every 8 (eight) hours as needed for nausea or vomiting. 08/26/16   Cuthriell, Delorise RoyalsJonathan D, PA-C  predniSONE (DELTASONE) 10 MG tablet Take 4 tablets (40 mg) day one,  3 tablets day two, 2 tablets day three, 1 tablet day four 06/03/11   Grant FontanaWilliams, Catherine, PA-C    Allergies Patient has no known allergies.  No family history on file.  Social History Social History  Substance Use Topics  . Smoking status: Current Every Day Smoker  . Smokeless tobacco: Not on file  . Alcohol use No     Review of Systems  Constitutional: No fever/chills Eyes: No visual changes. No discharge ENT: No upper respiratory complaints. Cardiovascular: no chest pain. Respiratory: no cough. No SOB. Gastrointestinal: No abdominal pain.  No nausea, no vomiting.  Musculoskeletal: Negative for musculoskeletal pain. Skin: Positive for abscess to the left axilla Neurological: Negative for headaches, focal weakness or numbness. 10-point ROS otherwise negative.  ____________________________________________   PHYSICAL EXAM:  VITAL SIGNS: ED Triage Vitals [08/26/16 1648]  Enc Vitals Group     BP 114/76     Pulse Rate 75     Resp      Temp 98.3 F (36.8 C)     Temp Source Oral     SpO2 99 %     Weight 210 lb (95.3 kg)     Height 5\' 7"  (1.702 m)     Head Circumference      Peak Flow      Pain Score 6     Pain Loc      Pain Edu?      Excl. in GC?      Constitutional: Alert and oriented. Well appearing and in no acute distress.  Eyes: Conjunctivae are normal. PERRL. EOMI. Head: Atraumatic. ENT:      Ears:       Nose: No congestion/rhinnorhea.      Mouth/Throat: Mucous membranes are moist.  Neck: No stridor.   Hematological/Lymphatic/Immunilogical: No cervical lymphadenopathy. Cardiovascular: Normal rate, regular rhythm. Normal S1 and S2.  Good peripheral circulation. Respiratory: Normal respiratory effort without tachypnea or retractions. Lungs CTAB. Good air entry to the bases with no decreased or absent breath sounds. Musculoskeletal: Full range of motion to all extremities. No gross deformities appreciated. Neurologic:  Normal speech and language. No gross  focal neurologic deficits are appreciated.  Skin:  Skin is warm, dry and intact. No rash noted.Erythema and edema noted to left axilla. Central lesion is draining minimal amount of bloody drainage. No frank pus. Area is firm to palpation. Central region indurated but is open and draining. Minimal expressed bloody drainage is appreciated. No significant pustular drainage. Psychiatric: Mood and affect are normal. Speech and behavior are normal. Patient exhibits appropriate insight and judgement.   ____________________________________________   LABS (all labs ordered are listed, but only abnormal results are displayed)  Labs Reviewed - No data to display ____________________________________________  EKG   ____________________________________________  RADIOLOGY   No results found.  ____________________________________________    PROCEDURES  Procedure(s) performed:    Procedures    Medications - No data to display   ____________________________________________   INITIAL IMPRESSION / ASSESSMENT AND PLAN / ED COURSE  Pertinent labs & imaging results that were available during my care of the patient were reviewed by me and considered in my medical decision making (see chart for details).  Review of the [place] CSRS was performed in accordance of the [facility] prior to dispensing any controlled drugs.     Patient's diagnosis is consistent with left axillary abscess. This was likely folliculitis on his increased into abscess. This is open and draining at this time. Scant amount of bloody drainage is appreciated. At this time, no indication for further incision as abscess is draining on its own. Area appears to be shallow and would not tolerate packing even should this area be incised further.. Patient will be discharged home with prescriptions for clindamycin, very limited pain medication, Zofran. Patient is to follow up with primary care as needed or otherwise directed. Patient is  given ED precautions to return to the ED for any worsening or new symptoms.     ____________________________________________  FINAL CLINICAL IMPRESSION(S) / ED DIAGNOSES  Final diagnoses:  Abscess of left axilla      NEW MEDICATIONS STARTED DURING THIS VISIT:  New Prescriptions   CLINDAMYCIN (CLEOCIN) 300 MG CAPSULE    Take 1 capsule (300 mg total) by mouth 3 (three) times daily.   HYDROCODONE-ACETAMINOPHEN (NORCO/VICODIN) 5-325 MG TABLET    Take 1 tablet by mouth every 6 (six) hours as needed for moderate pain.   ONDANSETRON (ZOFRAN-ODT) 4 MG DISINTEGRATING TABLET    Take 1 tablet (4 mg total) by mouth every 8 (eight) hours as needed for nausea or vomiting.        This chart was dictated using voice recognition software/Dragon. Despite best efforts to proofread, errors can occur which can change the meaning. Any change was purely unintentional.    Racheal Patches, PA-C 08/26/16 1755    Willy Eddy, MD 08/26/16 [phone]

## 2016-08-26 NOTE — ED Triage Notes (Signed)
Patient c/o draining abscess in left axilla

## 2016-11-23 IMAGING — CR DG CHEST 2V
1 series · 2 of 2 positions shown · non-contrast
Comparison: 12/03/2013

CLINICAL DATA: Sudden onset left-sided chest pain radiating down
the left arm. Left-sided face and neck field non. Tingling and
sweating. Began 45 min ago while driving. Shortness of breath and
diaphoresis.

EXAM:
CHEST  2 VIEW

[Series 1: w chest pa · 0.14mm/px · 2 of 2 slices shown]
[im 1/2]
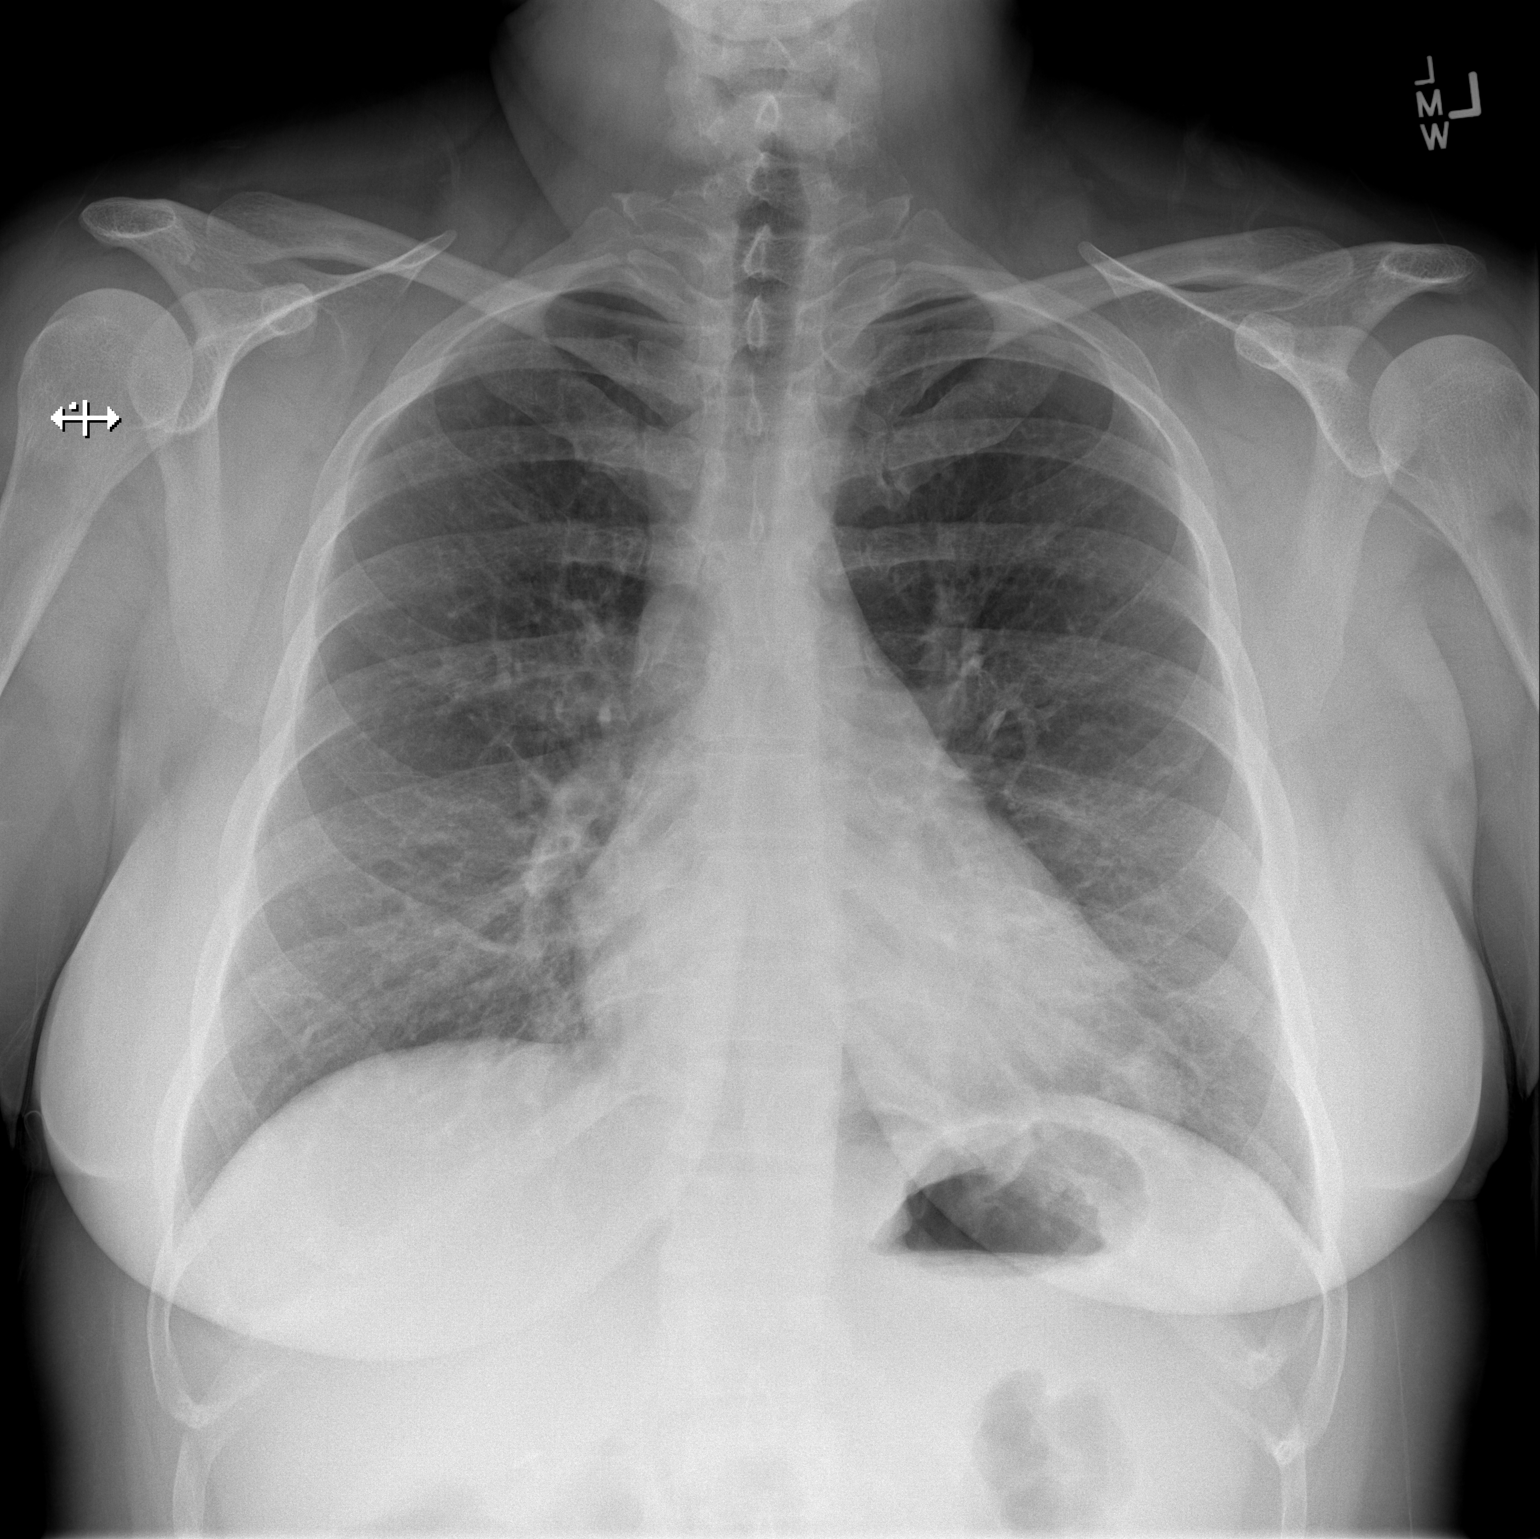
[im 2/2]
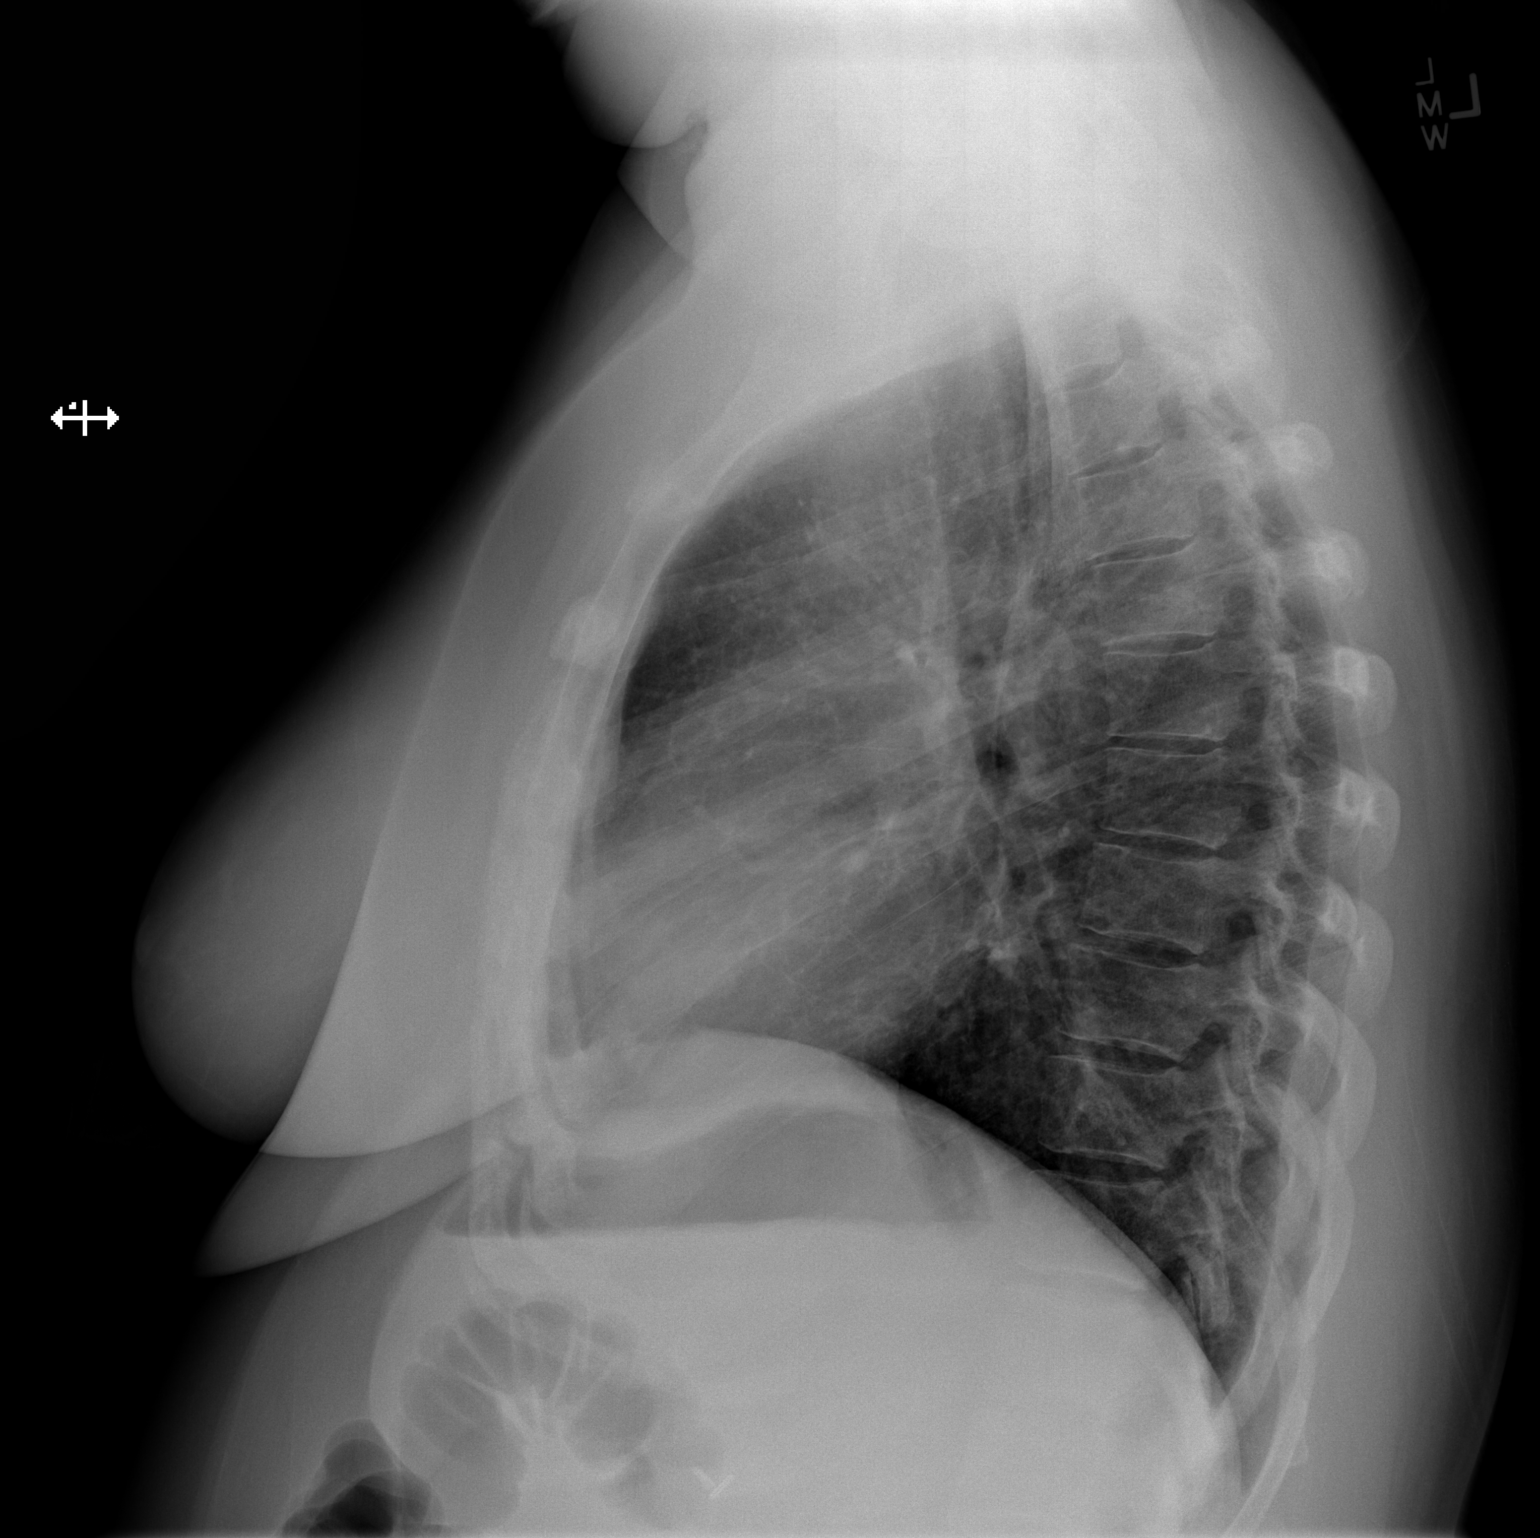

[2 of 2 positions shown; findings below may reference images not displayed]

FINDINGS: The heart size and mediastinal contours are within normal limits.
Both lungs are clear. The visualized skeletal structures are
unremarkable.
IMPRESSION: No active cardiopulmonary disease.

## 2018-09-30 ENCOUNTER — Other Ambulatory Visit: Payer: Self-pay

## 2018-09-30 ENCOUNTER — Emergency Department
Admission: EM | Admit: 2018-09-30 | Discharge: 2018-10-01 | Disposition: A | Discharge status: Home / Self Care | Payer: Self-pay | Attending: Emergency Medicine | Admitting: Emergency Medicine

## 2018-09-30 DIAGNOSIS — N611 Abscess of the breast and nipple: Secondary | ICD-10-CM | POA: Insufficient documentation

## 2018-09-30 DIAGNOSIS — F17210 Nicotine dependence, cigarettes, uncomplicated: Secondary | ICD-10-CM | POA: Insufficient documentation

## 2018-09-30 LAB — COMPREHENSIVE METABOLIC PANEL
ALT: 29 U/L (ref 0–44)
AST: 27 U/L (ref 15–41)
Albumin: 4 g/dL (ref 3.5–5.0)
Alkaline Phosphatase: 55 U/L (ref 38–126)
Anion gap: 8 (ref 5–15)
BUN: 12 mg/dL (ref 6–20)
CO2: 20 mmol/L — ABNORMAL LOW (ref 22–32)
Calcium: 8.6 mg/dL — ABNORMAL LOW (ref 8.9–10.3)
Chloride: 109 mmol/L (ref 98–111)
Creatinine, Ser: 0.75 mg/dL (ref 0.44–1.00)
GFR calc Af Amer: >60 mL/min (ref >60)
GFR calc non Af Amer: >60 mL/min (ref >60)
Glucose, Bld: 153 mg/dL — ABNORMAL HIGH (ref 70–99)
Potassium: 3.8 mmol/L (ref 3.5–5.1)
Sodium: 137 mmol/L (ref 135–145)
Total Bilirubin: 0.7 mg/dL (ref 0.3–1.2)
Total Protein: 7.4 g/dL (ref 6.5–8.1)

## 2018-09-30 LAB — CBC WITH DIFFERENTIAL/PLATELET
Abs Immature Granulocytes: 0.05 10*3/uL (ref 0.00–0.07)
Basophils Absolute: 0.1 10*3/uL (ref 0.0–0.1)
Basophils Relative: 1 %
Eosinophils Absolute: 0.4 10*3/uL (ref 0.0–0.5)
Eosinophils Relative: 3 %
HCT: 38.2 % (ref 36.0–46.0)
Hemoglobin: 13.3 g/dL (ref 12.0–15.0)
Immature Granulocytes: 1 %
Lymphocytes Relative: 18 %
Lymphs Abs: 1.9 10*3/uL (ref 0.7–4.0)
MCH: 32.3 pg (ref 26.0–34.0)
MCHC: 34.8 g/dL (ref 30.0–36.0)
MCV: 92.7 fL (ref 80.0–100.0)
Monocytes Absolute: 0.7 10*3/uL (ref 0.1–1.0)
Monocytes Relative: 6 %
Neutro Abs: 7.4 10*3/uL (ref 1.7–7.7)
Neutrophils Relative %: 71 %
Platelets: 321 10*3/uL (ref 150–400)
RBC: 4.12 MIL/uL (ref 3.87–5.11)
RDW: 13 % (ref 11.5–15.5)
WBC: 10.5 10*3/uL (ref 4.0–10.5)
nRBC: 0 % (ref 0.0–0.2)

## 2018-09-30 MED ORDER — CLINDAMYCIN HCL 150 MG PO CAPS
300.0000 mg | ORAL_CAPSULE | Freq: Once | ORAL | Status: AC
  Administered 2018-10-01: 300 mg via ORAL
  Filled 2018-09-30: qty 2

## 2018-09-30 MED ORDER — SODIUM CHLORIDE 0.9% FLUSH
3.0000 mL | Freq: Once | INTRAVENOUS | Status: AC
  Administered 2018-09-30: 3 mL via INTRAVENOUS

## 2018-09-30 MED ORDER — CLINDAMYCIN HCL 300 MG PO CAPS: 300.0000 mg | ORAL_CAPSULE | Freq: Three times a day (TID) | ORAL | 0 refills | Status: AC

## 2018-09-30 NOTE — ED Provider Notes (Signed)
[facility] Emergency Department Provider Note ____________________________________________   First MD Initiated Contact with Patient 09/30/18 2327     (approximate)  I have reviewed the triage vital signs and the nursing notes.   HISTORY  Chief Complaint Abscess    HPI Brittany Keller is a 38 y.o. female with PMH as noted below who presents with a lesion to her left breast which appeared over the last week.  It was initially red and swollen, and then opened and began draining pus.  The patient states that she called her PMD and sent pictures, and she was prescribed amoxicillin.  She started yesterday but states that the redness and pain has persisted.  She has no fever or chills.  She has no swelling to the breast in general.  The patient does have a history of prior abscesses.  Past Medical History:  Diagnosis Date  . Anxiety     There are no active problems to display for this patient.   History reviewed. No pertinent surgical history.  Prior to Admission medications   Medication Sig Start Date End Date Taking? Authorizing Provider  clindamycin (CLEOCIN) 300 MG capsule Take 1 capsule (300 mg total) by mouth 3 (three) times daily for 7 days. 09/30/18 10/07/18  Dionne BucySiadecki, Thayne Cindric, MD  escitalopram (LEXAPRO) 10 MG tablet Take 10 mg by mouth daily.    [provider]  HYDROcodone-acetaminophen (NORCO/VICODIN) 5-325 MG tablet Take 1 tablet by mouth every 6 (six) hours as needed for moderate pain. 08/26/16   Cuthriell, Delorise RoyalsJonathan D, PA-C  ibuprofen (ADVIL,MOTRIN) 200 MG tablet Take 200-600 mg by mouth every 6 (six) hours as needed. pain    [provider]  lisdexamfetamine (VYVANSE) 30 MG capsule Take 30 mg by mouth every morning.    [provider]  ondansetron (ZOFRAN-ODT) 4 MG disintegrating tablet Take 1 tablet (4 mg total) by mouth every 8 (eight) hours as needed for nausea or vomiting. 08/26/16   Cuthriell, Delorise RoyalsJonathan D, PA-C   predniSONE (DELTASONE) 10 MG tablet Take 4 tablets (40 mg) day one, 3 tablets day two, 2 tablets day three, 1 tablet day four 06/03/11   Grant FontanaWilliams, Catherine, PA-C    Allergies Patient has no known allergies.  No family history on file.  Social History Social History   Tobacco Use  . Smoking status: Current Every Day Smoker  . Smokeless tobacco: Never Used  Substance Use Topics  . Alcohol use: No  . Drug use: No    Review of Systems  Constitutional: No fever/chills. Eyes: No redness. ENT: No sore throat. Cardiovascular: Denies chest pain. Respiratory: Denies shortness of breath. Gastrointestinal: No vomiting or diarrhea.  Genitourinary: Negative for dysuria.  Musculoskeletal: Negative for back pain. Skin: Positive for left breast lesion. Neurological: Negative for headache.   ____________________________________________   PHYSICAL EXAM:  VITAL SIGNS: ED Triage Vitals  Enc Vitals Group     BP 09/30/18 2132 135/79     Pulse Rate 09/30/18 2132 87     Resp 09/30/18 2132 18     Temp 09/30/18 2132 98 F (36.7 C)     Temp Source 09/30/18 2132 Oral     SpO2 09/30/18 2132 97 %     Weight 09/30/18 2130 252 lb (114.3 kg)     Height 09/30/18 2130 5\' 6"  (1.676 m)     Head Circumference --      Peak Flow --      Pain Score 09/30/18 2130 5  Pain Loc --      Pain Edu? --      Excl. in [place]? --     Constitutional: Alert and oriented. Well appearing and in no acute distress. Eyes: Conjunctivae are normal.  Head: Atraumatic. Nose: No congestion/rhinnorhea. Mouth/Throat: Mucous membranes are moist.   Neck: Normal range of motion.  Cardiovascular:Good peripheral circulation. Respiratory: Normal respiratory effort.  No retractions. Gastrointestinal:  No distention.  Musculoskeletal: Extremities warm and well perfused.  Neurologic:  Normal speech and language. No gross focal neurologic deficits are appreciated.  Skin:  Skin is warm and dry.  Approximately 6 o'clock  position of left breast with 3 cm area of erythema and induration with approximately 1 cm superficial open wound centrally..  There is no fluctuance or significant tenderness.  The remainder of the breast is not erythematous or swollen. Psychiatric: Mood and affect are normal. Speech and behavior are normal.  ____________________________________________   LABS (all labs ordered are listed, but only abnormal results are displayed)  Labs Reviewed  COMPREHENSIVE METABOLIC PANEL - Abnormal; Notable for the following components:      Result Value   CO2 20 (*)    Glucose, Bld 153 (*)    Calcium 8.6 (*)    All other components within normal limits  CBC WITH DIFFERENTIAL/PLATELET   ____________________________________________  EKG   ____________________________________________  RADIOLOGY    ____________________________________________   PROCEDURES  Procedure(s) performed: No  Procedures  Critical Care performed: No ____________________________________________   INITIAL IMPRESSION / ASSESSMENT AND PLAN / ED COURSE  Pertinent labs & imaging results that were available during my care of the patient were reviewed by me and considered in my medical decision making (see chart for details).  38 year old female with PMH as noted above presents with a lesion to her left breast which was initially red and swollen, and then began draining pus spontaneously.  Drainage has now stopped.  She was started on amoxicillin yesterday but reports persistent redness and some pain.  Patient has no fever or other systemic symptoms.  I reviewed the past medical records in [place].  The patient was seen in the ED previously in 2018 for an axillary abscess.  The patient states that her daughter has cystic fibrosis and so she is in the hospital with her frequently, so she may be at risk for MRSA.    On exam in approximately 3 to 4 cm area of cellulitic changes at the 6 o'clock position of the left breast  with no significant fluctuance.  Her vital signs are normal.  The remainder of the exam is unremarkable.  Based on the clinical presentation and physical exam, it appears that the patient had an abscess which spontaneously drained.  I do not feel any fluctuance or evidence of persistent fluid collection.  I performed a bedside ultrasound on the area to confirm that there is no evidence of a fluid collection consistent with ongoing abscess.  Given that the patient is at risk for MRSA she likely requires broader antibiotic coverage so I will give p.o. clindamycin.  Her lab work-up is unremarkable and there is no evidence of systemic infection or sepsis.  The patient is stable for discharge home.  I gave her thorough return precautions and she expressed understanding.  ____________________________________________   FINAL CLINICAL IMPRESSION(S) / ED DIAGNOSES  Final diagnoses:  Left breast abscess      NEW MEDICATIONS STARTED DURING THIS VISIT:  New Prescriptions   CLINDAMYCIN (CLEOCIN) 300 MG CAPSULE  Take 1 capsule (300 mg total) by mouth 3 (three) times daily for 7 days.     Note:  This document was prepared using Dragon voice recognition software and may include unintentional dictation errors.    Dionne BucySiadecki, Aleksia Freiman, MD 10/01/18 0002

## 2018-09-30 NOTE — Discharge Instructions (Addendum)
It appears that your abscess has mostly drained on its own and does not require incision to open it up.  Stop the amoxicillin and start taking the clindamycin instead.  Apply a warm compress for 15 to 20 minutes several times per day to the area.  You should put bacitracin or Neosporin ointment on the open wound and keep it covered with a dressing.  Return to the ER for new or worsening redness, spreading rash, worsening open wound, persistent drainage of pus or fluid, swelling of the breast, fever or chills, or any other new or worsening symptoms that concern you.

## 2018-09-30 NOTE — ED Triage Notes (Signed)
Pt arrives to ED via POV from home with c/o left side breast pain d/t a possible infection from a spider bite x1 week. Pt was given antibiotics from her PCP that she started yesterday (Amoxicillin 500mg  BID). Pt denies fever, no N/V.

## 2018-12-29 ENCOUNTER — Other Ambulatory Visit: Payer: Self-pay

## 2018-12-29 DIAGNOSIS — Z20822 Contact with and (suspected) exposure to covid-19: Secondary | ICD-10-CM

## 2018-12-31 LAB — NOVEL CORONAVIRUS, NAA: SARS-CoV-2, NAA: NOT DETECTED

## 2019-03-09 ENCOUNTER — Ambulatory Visit: Payer: Medicaid Other | Attending: Internal Medicine

## 2019-03-09 DIAGNOSIS — Z20822 Contact with and (suspected) exposure to covid-19: Secondary | ICD-10-CM

## 2019-03-10 LAB — NOVEL CORONAVIRUS, NAA: SARS-CoV-2, NAA: DETECTED — AB

## 2019-03-11 ENCOUNTER — Telehealth: Payer: Self-pay | Admitting: Nurse Practitioner

## 2019-03-11 NOTE — Telephone Encounter (Signed)
Called to Discuss with patient about Covid symptoms and the use of bamlanivimab, a monoclonal antibody infusion for those with mild to moderate Covid symptoms and at a high risk of hospitalization.     Pt is qualified for this infusion at the [facility] infusion center due to co-morbid conditions and/or a member of an at-risk group.    Note: Patient may qualify due to BMI, but need an updated BMI in chart.    Unable to reach pt

## 2019-05-26 ENCOUNTER — Encounter: Payer: Self-pay | Admitting: Emergency Medicine

## 2019-05-26 ENCOUNTER — Emergency Department
Admission: EM | Admit: 2019-05-26 | Discharge: 2019-05-26 | Disposition: A | Discharge status: Home / Self Care | Payer: Medicaid Other | Attending: Emergency Medicine | Admitting: Emergency Medicine

## 2019-05-26 ENCOUNTER — Other Ambulatory Visit: Payer: Self-pay

## 2019-05-26 DIAGNOSIS — Z79899 Other long term (current) drug therapy: Secondary | ICD-10-CM | POA: Insufficient documentation

## 2019-05-26 DIAGNOSIS — Z8616 Personal history of COVID-19: Secondary | ICD-10-CM | POA: Insufficient documentation

## 2019-05-26 DIAGNOSIS — F17210 Nicotine dependence, cigarettes, uncomplicated: Secondary | ICD-10-CM | POA: Insufficient documentation

## 2019-05-26 DIAGNOSIS — L03221 Cellulitis of neck: Secondary | ICD-10-CM | POA: Insufficient documentation

## 2019-05-26 HISTORY — DX: COVID-19: U07.1

## 2019-05-26 MED ORDER — CEPHALEXIN 500 MG PO CAPS: 500.0000 mg | ORAL_CAPSULE | Freq: Three times a day (TID) | ORAL | 0 refills | Status: AC

## 2019-05-26 MED ORDER — CEPHALEXIN 500 MG PO CAPS
500.0000 mg | ORAL_CAPSULE | Freq: Once | ORAL | Status: AC
  Administered 2019-05-26: 500 mg via ORAL
  Filled 2019-05-26: qty 1

## 2019-05-26 MED ORDER — SULFAMETHOXAZOLE-TRIMETHOPRIM 800-160 MG PO TABS
1.0000 | ORAL_TABLET | Freq: Once | ORAL | Status: AC
  Administered 2019-05-26: 1 via ORAL
  Filled 2019-05-26: qty 1

## 2019-05-26 MED ORDER — SULFAMETHOXAZOLE-TRIMETHOPRIM 800-160 MG PO TABS: 1.0000 | ORAL_TABLET | Freq: Two times a day (BID) | ORAL | 0 refills | Status: AC

## 2019-05-26 NOTE — ED Triage Notes (Signed)
Pt c/o insect bite x 3 days to back of neck c/o pain and pressure

## 2019-05-26 NOTE — ED Notes (Signed)
See triage note- pt here for possible insect bite on the back of her neck that is red and warm to the touch. Pt states the area is very tight. No drainage noted.

## 2019-05-26 NOTE — ED Provider Notes (Signed)
Emergency Department Provider Note  ____________________________________________  Time seen: Approximately 8:31 PM  I have reviewed the triage vital signs and the nursing notes.   HISTORY  Chief Complaint Insect Bite   Historian Patient    HPI Brittany Keller is a 39 y.o. female presents to the emergency department with a 2 cm x 2 cm region of erythema along the posterior aspect of the neck in vicinity of a tattoo that has been apparent for the past three days.  Patient states that area has been spontaneously draining today.  She denies fever or chills at home.  She states that she has had 1 prior abscess in the past.  No other alleviating measures have been attempted.   Past Medical History:  Diagnosis Date  . Anxiety   . COVID-19      Immunizations up to date:  Yes.     Past Medical History:  Diagnosis Date  . Anxiety   . COVID-19     There are no problems to display for this patient.   History reviewed. No pertinent surgical history.  Prior to Admission medications   Medication Sig Start Date End Date Taking? Authorizing Provider  cephALEXin (KEFLEX) 500 MG capsule Take 1 capsule (500 mg total) by mouth 3 (three) times daily for 7 days. 05/26/19 06/02/19  Orvil Feil, PA-C  escitalopram (LEXAPRO) 10 MG tablet Take 10 mg by mouth daily.    [provider]  HYDROcodone-acetaminophen (NORCO/VICODIN) 5-325 MG tablet Take 1 tablet by mouth every 6 (six) hours as needed for moderate pain. 08/26/16   Cuthriell, Delorise Royals, PA-C  ibuprofen (ADVIL,MOTRIN) 200 MG tablet Take 200-600 mg by mouth every 6 (six) hours as needed. pain    [provider]  lisdexamfetamine (VYVANSE) 30 MG capsule Take 30 mg by mouth every morning.    [provider]  ondansetron (ZOFRAN-ODT) 4 MG disintegrating tablet Take 1 tablet (4 mg total) by mouth every 8 (eight) hours as needed for nausea or vomiting. 08/26/16   Cuthriell, Delorise Royals, PA-C  predniSONE  (DELTASONE) 10 MG tablet Take 4 tablets (40 mg) day one, 3 tablets day two, 2 tablets day three, 1 tablet day four 06/03/11   Grant Fontana, PA-C  sulfamethoxazole-trimethoprim (BACTRIM DS) 800-160 MG tablet Take 1 tablet by mouth 2 (two) times daily for 7 days. 05/26/19 06/02/19  Orvil Feil, PA-C    Allergies Patient has no known allergies.  No family history on file.  Social History Social History   Tobacco Use  . Smoking status: Current Every Day Smoker    Types: Cigarettes  . Smokeless tobacco: Never Used  Substance Use Topics  . Alcohol use: No  . Drug use: No     Review of Systems  Constitutional: No fever/chills Eyes:  No discharge ENT: No upper respiratory complaints. Respiratory: no cough. No SOB/ use of accessory muscles to breath Gastrointestinal:   No nausea, no vomiting.  No diarrhea.  No constipation. Musculoskeletal: Negative for musculoskeletal pain. Skin: Patient has cellulitis of neck.   ____________________________________________   PHYSICAL EXAM:  VITAL SIGNS: ED Triage Vitals [05/26/19 1942]  Enc Vitals Group     BP 133/70     Pulse Rate 84     Resp 16     Temp 98.1 F (36.7 C)     Temp Source Oral     SpO2 98 %     Weight 235 lb (106.6 kg)     Height 5\' 7"  (1.702 m)  Head Circumference      Peak Flow      Pain Score 4     Pain Loc      Pain Edu?      Excl. in [place]?      Constitutional: Alert and oriented. Well appearing and in no acute distress. Eyes: Conjunctivae are normal. PERRL. EOMI. Head: Atraumatic. ENT:      Ears: TMs are pearly.       Nose: No congestion/rhinnorhea.  Patient has a 2 cm x 2 cm region of erythema along the posterior aspect of the neck.  There is associated induration but no palpable fluctuance.      Mouth/Throat: Mucous membranes are moist.  Neck: No stridor.  No cervical spine tenderness to palpation. Cardiovascular: Normal rate, regular rhythm. Normal S1 and S2.  Good peripheral  circulation. Respiratory: Normal respiratory effort without tachypnea or retractions. Lungs CTAB. Good air entry to the bases with no decreased or absent breath sounds Musculoskeletal: Full range of motion to all extremities. No obvious deformities noted Neurologic:  Normal for age. No gross focal neurologic deficits are appreciated.  Skin:  Skin is warm, dry and intact. No rash noted. Psychiatric: Mood and affect are normal for age. Speech and behavior are normal.   ____________________________________________   LABS (all labs ordered are listed, but only abnormal results are displayed)  Labs Reviewed - No data to display ____________________________________________  EKG   ____________________________________________  RADIOLOGY   No results found.  ____________________________________________    PROCEDURES  Procedure(s) performed:     Procedures     Medications  sulfamethoxazole-trimethoprim (BACTRIM DS) 800-160 MG per tablet 1 tablet (has no administration in time range)  cephALEXin (KEFLEX) capsule 500 mg (has no administration in time range)     ____________________________________________   INITIAL IMPRESSION / ASSESSMENT AND PLAN / ED COURSE  Pertinent labs & imaging results that were available during my care of the patient were reviewed by me and considered in my medical decision making (see chart for details).      Assessment and plan Cellulitis 39 year old female presents to the emergency department with a 2 cm x 2 cm region of cellulitis along the posterior neck with spontaneous drainage, increasing concern for possible abscess.  Vital signs were reassuring at triage.  Recommended incision and drainage and patient declined at this time stating that she wanted to try a trial of outpatient antibiotics.  Complications and risk associated with unmanaged abscesses at the neck were conveyed to patient and she voiced understanding.  Patient was  discharged with Bactrim and Keflex.  Return precautions were given to return with new or worsening symptoms.  All patient questions were answered.   ____________________________________________  FINAL CLINICAL IMPRESSION(S) / ED DIAGNOSES  Final diagnoses:  Cellulitis of neck      NEW MEDICATIONS STARTED DURING THIS VISIT:  ED Discharge Orders         Ordered    sulfamethoxazole-trimethoprim (BACTRIM DS) 800-160 MG tablet  2 times daily     05/26/19 2023    cephALEXin (KEFLEX) 500 MG capsule  3 times daily     05/26/19 2023              This chart was dictated using voice recognition software/Dragon. Despite best efforts to proofread, errors can occur which can change the meaning. Any change was purely unintentional.     Karren Cobble 05/26/19 2035    Nance Pear, MD 05/26/19 2149

## 2020-07-04 DIAGNOSIS — Z20828 Contact with and (suspected) exposure to other viral communicable diseases: Secondary | ICD-10-CM | POA: Diagnosis not present

## 2020-07-04 DIAGNOSIS — R059 Cough, unspecified: Secondary | ICD-10-CM | POA: Diagnosis not present

## 2020-11-03 DIAGNOSIS — Z20828 Contact with and (suspected) exposure to other viral communicable diseases: Secondary | ICD-10-CM | POA: Diagnosis not present

## 2020-11-03 DIAGNOSIS — R059 Cough, unspecified: Secondary | ICD-10-CM | POA: Diagnosis not present

## 2022-04-09 ENCOUNTER — Telehealth: Payer: Self-pay

## 2022-04-09 NOTE — Telephone Encounter (Signed)
Mychart msg sent. AS, CMA

## 2022-11-22 DIAGNOSIS — R0609 Other forms of dyspnea: Secondary | ICD-10-CM | POA: Diagnosis not present

## 2022-11-22 DIAGNOSIS — J189 Pneumonia, unspecified organism: Secondary | ICD-10-CM | POA: Diagnosis not present

## 2022-11-22 DIAGNOSIS — Z20822 Contact with and (suspected) exposure to covid-19: Secondary | ICD-10-CM | POA: Diagnosis not present

## 2022-11-29 ENCOUNTER — Emergency Department: Payer: Medicaid Other

## 2022-11-29 ENCOUNTER — Other Ambulatory Visit: Payer: Self-pay

## 2022-11-29 ENCOUNTER — Observation Stay
Admission: EM | Admit: 2022-11-29 | Discharge: 2022-11-30 | Disposition: A | Discharge status: Home / Self Care | Payer: Medicaid Other | Attending: Internal Medicine | Admitting: Internal Medicine

## 2022-11-29 DIAGNOSIS — Z8616 Personal history of COVID-19: Secondary | ICD-10-CM | POA: Insufficient documentation

## 2022-11-29 DIAGNOSIS — F17210 Nicotine dependence, cigarettes, uncomplicated: Secondary | ICD-10-CM | POA: Diagnosis not present

## 2022-11-29 DIAGNOSIS — J189 Pneumonia, unspecified organism: Secondary | ICD-10-CM | POA: Diagnosis not present

## 2022-11-29 DIAGNOSIS — D649 Anemia, unspecified: Secondary | ICD-10-CM | POA: Diagnosis not present

## 2022-11-29 DIAGNOSIS — E039 Hypothyroidism, unspecified: Secondary | ICD-10-CM | POA: Insufficient documentation

## 2022-11-29 DIAGNOSIS — E872 Acidosis, unspecified: Secondary | ICD-10-CM

## 2022-11-29 DIAGNOSIS — F419 Anxiety disorder, unspecified: Secondary | ICD-10-CM | POA: Insufficient documentation

## 2022-11-29 DIAGNOSIS — Z23 Encounter for immunization: Secondary | ICD-10-CM | POA: Diagnosis not present

## 2022-11-29 DIAGNOSIS — E119 Type 2 diabetes mellitus without complications: Secondary | ICD-10-CM | POA: Diagnosis not present

## 2022-11-29 DIAGNOSIS — Z7984 Long term (current) use of oral hypoglycemic drugs: Secondary | ICD-10-CM | POA: Diagnosis not present

## 2022-11-29 DIAGNOSIS — D500 Iron deficiency anemia secondary to blood loss (chronic): Principal | ICD-10-CM | POA: Insufficient documentation

## 2022-11-29 DIAGNOSIS — D62 Acute posthemorrhagic anemia: Secondary | ICD-10-CM | POA: Diagnosis not present

## 2022-11-29 DIAGNOSIS — R0602 Shortness of breath: Principal | ICD-10-CM

## 2022-11-29 DIAGNOSIS — E8721 Acute metabolic acidosis: Secondary | ICD-10-CM | POA: Insufficient documentation

## 2022-11-29 DIAGNOSIS — N939 Abnormal uterine and vaginal bleeding, unspecified: Secondary | ICD-10-CM

## 2022-11-29 LAB — CBC
HCT: 22.8 % — ABNORMAL LOW (ref 36.0–46.0)
Hemoglobin: 6.6 g/dL — ABNORMAL LOW (ref 12.0–15.0)
MCH: 19.3 pg — ABNORMAL LOW (ref 26.0–34.0)
MCHC: 28.9 g/dL — ABNORMAL LOW (ref 30.0–36.0)
MCV: 66.7 fL — ABNORMAL LOW (ref 80.0–100.0)
Platelets: 463 10*3/uL — ABNORMAL HIGH (ref 150–400)
RBC: 3.42 MIL/uL — ABNORMAL LOW (ref 3.87–5.11)
RDW: 19.3 % — ABNORMAL HIGH (ref 11.5–15.5)
WBC: 15 10*3/uL — ABNORMAL HIGH (ref 4.0–10.5)
nRBC: 0.5 % — ABNORMAL HIGH (ref 0.0–0.2)

## 2022-11-29 LAB — BASIC METABOLIC PANEL
Anion gap: 11 (ref 5–15)
BUN: 18 mg/dL (ref 6–20)
CO2: 17 mmol/L — ABNORMAL LOW (ref 22–32)
Calcium: 8.7 mg/dL — ABNORMAL LOW (ref 8.9–10.3)
Chloride: 104 mmol/L (ref 98–111)
Creatinine, Ser: 1.12 mg/dL — ABNORMAL HIGH (ref 0.44–1.00)
GFR, Estimated: >60 mL/min (ref >60)
Glucose, Bld: 138 mg/dL — ABNORMAL HIGH (ref 70–99)
Potassium: 3.8 mmol/L (ref 3.5–5.1)
Sodium: 132 mmol/L — ABNORMAL LOW (ref 135–145)

## 2022-11-29 LAB — HCG, QUANTITATIVE, PREGNANCY: hCG, Beta Chain, Quant, S: <1 m[IU]/mL (ref <5)

## 2022-11-29 LAB — CBG MONITORING, ED: Glucose-Capillary: 106 mg/dL — ABNORMAL HIGH (ref 70–99)

## 2022-11-29 LAB — TROPONIN I (HIGH SENSITIVITY): Troponin I (High Sensitivity): 11 ng/L (ref <18)

## 2022-11-29 LAB — HEMOGLOBIN AND HEMATOCRIT, BLOOD
HCT: 22.7 % — ABNORMAL LOW (ref 36.0–46.0)
Hemoglobin: 6.8 g/dL — ABNORMAL LOW (ref 12.0–15.0)

## 2022-11-29 LAB — PREPARE RBC (CROSSMATCH)

## 2022-11-29 LAB — ABO/RH: ABO/RH(D): B POS

## 2022-11-29 MED ORDER — INSULIN ASPART 100 UNIT/ML IJ SOLN
0.0000 [IU] | Freq: Three times a day (TID) | INTRAMUSCULAR | Status: DC
  Administered 2022-11-30: 2 [IU] via SUBCUTANEOUS
  Filled 2022-11-29: qty 1

## 2022-11-29 MED ORDER — INSULIN ASPART 100 UNIT/ML IJ SOLN: 0.0000 [IU] | Freq: Every day | INTRAMUSCULAR | Status: DC

## 2022-11-29 MED ORDER — SODIUM CHLORIDE 0.9 % IV SOLN: INTRAVENOUS | Status: DC

## 2022-11-29 MED ORDER — ACETAMINOPHEN 325 MG PO TABS
650.0000 mg | ORAL_TABLET | Freq: Four times a day (QID) | ORAL | Status: DC | PRN
  Administered 2022-11-30 (×2): 650 mg via ORAL
  Filled 2022-11-29 (×2): qty 2

## 2022-11-29 MED ORDER — SODIUM CHLORIDE 0.9 % IV SOLN: 10.0000 mL/h | Freq: Once | INTRAVENOUS | Status: DC

## 2022-11-29 MED ORDER — ACETAMINOPHEN 650 MG RE SUPP: 650.0000 mg | Freq: Four times a day (QID) | RECTAL | Status: DC | PRN

## 2022-11-29 MED ORDER — ONDANSETRON HCL 4 MG/2ML IJ SOLN: 4.0000 mg | Freq: Four times a day (QID) | INTRAMUSCULAR | Status: DC | PRN

## 2022-11-29 MED ORDER — ONDANSETRON HCL 4 MG PO TABS: 4.0000 mg | ORAL_TABLET | Freq: Four times a day (QID) | ORAL | Status: DC | PRN

## 2022-11-29 MED ORDER — MAGNESIUM HYDROXIDE 400 MG/5ML PO SUSP: 30.0000 mL | Freq: Every day | ORAL | Status: DC | PRN

## 2022-11-29 MED ORDER — TRAZODONE HCL 50 MG PO TABS: 25.0000 mg | ORAL_TABLET | Freq: Every evening | ORAL | Status: DC | PRN

## 2022-11-29 NOTE — ED Notes (Signed)
Pt went to CT with Gerilyn Pilgrim, RN

## 2022-11-29 NOTE — Assessment & Plan Note (Addendum)
Resume home regimen. 

## 2022-11-29 NOTE — Assessment & Plan Note (Addendum)
On Synthroid

## 2022-11-29 NOTE — ED Notes (Signed)
Hgb 6.6 Hct: 22.8  Patient up for next bed.

## 2022-11-29 NOTE — ED Provider Notes (Signed)
[facility] Provider Note    Event Date/Time   First MD Initiated Contact with Patient 11/29/22 1624     (approximate)   History   Chief Complaint Shortness of Breath   HPI  Brittany Keller is a 42 y.o. female with past medical history of anxiety who presents to the ED complaining of shortness of breath.  Patient reports that she has been feeling increasingly short of breath over the past week, especially upon exertion.  She reports some mild tightness and discomfort in her chest along with a dry cough, has not had any fevers.  She was seen at urgent care a week ago and prescribed prednisone along with antibiotics, has not had any improvement since then.  She does state that she has had persistent heavy vaginal bleeding for the past 3 months, requiring her to change a pad multiple times a day.  She has not seen an OB/GYN for this problem, does state that the bleeding has eased up over the past 2 weeks.  She is passing clots at times, but denies passing anything other than blood.  She has had mild crampy pain in her pelvic area.  She does not take any blood thinners.     Physical Exam   Triage Vital Signs: ED Triage Vitals  Encounter Vitals Group     BP 11/29/22 1439 120/78     Systolic BP Percentile --      Diastolic BP Percentile --      Pulse Rate 11/29/22 1439 100     Resp 11/29/22 1439 19     Temp 11/29/22 1439 99 F (37.2 C)     Temp Source 11/29/22 1439 Oral     SpO2 11/29/22 1439 100 %     Weight 11/29/22 1436 237 lb (107.5 kg)     Height 11/29/22 1436 5\' 6"  (1.676 m)     Head Circumference --      Peak Flow --      Pain Score 11/29/22 1436 4     Pain Loc --      Pain Education --      Exclude from Growth Chart --     Most recent vital signs: Vitals:   11/29/22 1816 11/29/22 2041  BP: (!) 102/56 120/66  Pulse: 88 81  Resp: 16 15  Temp: 98.6 F (37 C) (!) 97.4 F (36.3 C)  SpO2: 97% 99%    Constitutional: Alert and  oriented. Eyes: Conjunctivae are normal. Head: Atraumatic. Nose: No congestion/rhinnorhea. Mouth/Throat: Mucous membranes are moist.  Cardiovascular: Normal rate, regular rhythm. Grossly normal heart sounds.  2+ radial pulses bilaterally. Respiratory: Normal respiratory effort.  No retractions. Lungs CTAB. Gastrointestinal: Soft and nontender. No distention. Musculoskeletal: No lower extremity tenderness nor edema.  Neurologic:  Normal speech and language. No gross focal neurologic deficits are appreciated.    ED Results / Procedures / Treatments   Labs (all labs ordered are listed, but only abnormal results are displayed) Labs Reviewed  BASIC METABOLIC PANEL - Abnormal; Notable for the following components:      Result Value   Sodium 132 (*)    CO2 17 (*)    Glucose, Bld 138 (*)    Creatinine, Ser 1.12 (*)    Calcium 8.7 (*)    All other components within normal limits  CBC - Abnormal; Notable for the following components:   WBC 15.0 (*)    RBC 3.42 (*)    Hemoglobin 6.6 (*)  HCT 22.8 (*)    MCV 66.7 (*)    MCH 19.3 (*)    MCHC 28.9 (*)    RDW 19.3 (*)    Platelets 463 (*)    nRBC 0.5 (*)    All other components within normal limits  HCG, QUANTITATIVE, PREGNANCY  POC URINE PREG, ED  TYPE AND SCREEN  PREPARE RBC (CROSSMATCH)  ABO/RH  TROPONIN I (HIGH SENSITIVITY)     EKG  ED ECG REPORT I, Chesley Noon, the attending physician, personally viewed and interpreted this ECG.   Date: 11/29/2022  EKG Time: 14:39  Rate: 100  Rhythm: sinus tachycardia  Axis: Normal  Intervals:none  ST&T Change: None  RADIOLOGY Chest x-ray reviewed and interpreted by me with no infiltrate, edema, or effusion.  PROCEDURES:  Critical Care performed: Yes, see critical care procedure note(s)  .Critical Care  Performed by: Chesley Noon, MD Authorized by: Chesley Noon, MD   Critical care provider statement:    Critical care time (minutes):  30   Critical care time  was exclusive of:  Separately billable procedures and treating other patients and teaching time   Critical care was necessary to treat or prevent imminent or life-threatening deterioration of the following conditions: Anemia.   Critical care was time spent personally by me on the following activities:  Development of treatment plan with patient or surrogate, discussions with consultants, evaluation of patient's response to treatment, examination of patient, ordering and review of laboratory studies, ordering and review of radiographic studies, ordering and performing treatments and interventions, pulse oximetry, re-evaluation of patient's condition and review of old charts   I assumed direction of critical care for this patient from another provider in my specialty: no     Care discussed with: admitting provider      MEDICATIONS ORDERED IN ED: Medications  0.9 %  sodium chloride infusion (0 mL/hr Intravenous Hold 11/29/22 1704)     IMPRESSION / MDM / ASSESSMENT AND PLAN / ED COURSE  I reviewed the triage vital signs and the nursing notes.                              42 y.o. female with past medical history of anxiety presents to the ED complaining of dyspnea on exertion worsening over the past week with some tightness in her chest and a dry cough.  Patient's presentation is most consistent with acute presentation with potential threat to life or bodily function.  Differential diagnosis includes, but is not limited to, pneumonia, CHF, COPD, ACS, PE, anemia, electrolyte abnormality, AKI.  Patient nontoxic-appearing and in no acute distress, vital signs are unremarkable.  EKG shows no evidence of arrhythmia or ischemia and troponin within normal limits, doubt ACS or PE.  Chest x-ray is unremarkable and it seems likely her symptoms are due to severe anemia with hemoglobin of 6.6.  We will transfuse 1 unit PRBCs, anemia seems likely due to vaginal bleeding that has been going on for multiple  months.  Plan to further assess with ultrasound and discussed with OB/GYN.  Patient also with leukocytosis but no findings concerning for infection currently, no AKI or electrolyte abnormality noted.  Pelvic ultrasound is unremarkable, case discussed with midwife Eunice Blase of OB/GYN, who states no acute intervention needed overnight but team will be available for consultation tomorrow.  Case discussed with hospitalist for admission.      FINAL CLINICAL IMPRESSION(S) / ED DIAGNOSES   Final diagnoses:  Vaginal bleeding  SOB (shortness of breath)  Anemia, unspecified type     Rx / DC Orders   ED Discharge Orders     None        Note:  This document was prepared using Dragon voice recognition software and may include unintentional dictation errors.   Chesley Noon, MD 11/29/22 2117

## 2022-11-29 NOTE — Assessment & Plan Note (Addendum)
Likely due to prolonged vaginal bleeding with chronic blood loss. Pt reported 4 months of bleeding that has been stopped. She noted progressive fatigue, weakness, DOE, anemic symptoms --Transfused 2 units RBC's --Hematology outpatient follow up --Repeat CBC at follow up  --Iron infusion/s as needed

## 2022-11-29 NOTE — Assessment & Plan Note (Addendum)
On Klonopin PRN

## 2022-11-29 NOTE — H&P (Signed)
[place]   PATIENT NAME: Brittany Keller    MR#:  [id number]  DATE OF BIRTH:  [birth year]  DATE OF ADMISSION:  11/29/2022  PRIMARY CARE PHYSICIAN: [facility]   Patient is coming from: Home  REQUESTING/REFERRING PHYSICIAN: Chesley Noon, MD  CHIEF COMPLAINT:   Chief Complaint  Patient presents with   Shortness of Breath    HISTORY OF PRESENT ILLNESS:  Brittany Keller is a 42 y.o. female with medical history significant for type diabetes mellitus, and hypothyroidism as well as anxiety, who presented to the emergency room with the onset of worsening dyspnea with associated dizziness.  She was diagnosed with pneumonia last week and was given amoxicillin, doxycycline and and prednisone.  She denies any nausea vomiting or abdominal pain.  Her cough is improved.  No current wheezing.  No dysuria, oliguria or hematuria or flank pain.  She has been having occasional diarrhea.  She admits to having vaginal bleeding continuously for 4 months and this has stopped over the last month.  No chest pain or palpitations.  No paresthesias or focal muscle weakness.  ED Course: When she had to the ER, temperature was 99 with otherwise normal vital signs.  Labs revealed mild hyponatremia and CO2 of 17 with calcium of 8.7.  CBC showed leukocytosis of 15 with anemia with hemoglobin 6.6 hematocrit 22.8 compared to 13.3 and 38.2 on 8//2020 though.  Platelets were 463 compared to 321 then.  RBCs and dizziness were low.  A serum pregnancy test was negative.  Blood group is B+ with negative antibody screen.   EKG as reviewed by me : EKG showed normal sinus rhythm with rate of 100. Imaging: Two-view chest x-ray showed no acute cardiopulmonary disease.  The patient was prepped crossmatch and will be transfused 1 unit of packed red blood cells.  She will be admitted to an observation Medical telemetry bed for further evaluation and management. PAST MEDICAL HISTORY:   Past Medical  History:  Diagnosis Date   Anxiety    COVID-19   -Type II Diabetes mellitus - Hypothyroidism  PAST SURGICAL HISTORY:  C-section twice Cholecystectomy  SOCIAL HISTORY:   Social History   Tobacco Use   Smoking status: Every Day    Types: Cigarettes   Smokeless tobacco: Never  Substance Use Topics   Alcohol use: No    FAMILY HISTORY:  Positive for diabetes mellitus, hypertension, and coronary artery disease.  DRUG ALLERGIES:  No Known Allergies  REVIEW OF SYSTEMS:   ROS As per history of present illness. All pertinent systems were reviewed above. Constitutional, HEENT, cardiovascular, respiratory, GI, GU, musculoskeletal, neuro, psychiatric, endocrine, integumentary and hematologic systems were reviewed and are otherwise negative/unremarkable except for positive findings mentioned above in the HPI.   MEDICATIONS AT HOME:   Prior to Admission medications   Medication Sig Start Date End Date Taking? Authorizing Provider  levothyroxine (SYNTHROID) 88 MCG tablet Take 88 mcg by mouth every morning. 10/24/22  Yes [provider]  metFORMIN (GLUCOPHAGE) 1000 MG tablet Take 1,000 mg by mouth 2 (two) times daily. 10/24/22  Yes [provider]  escitalopram (LEXAPRO) 10 MG tablet Take 10 mg by mouth daily. Patient not taking: Reported on 11/29/2022    [provider]  HYDROcodone-acetaminophen (NORCO/VICODIN) 5-325 MG tablet Take 1 tablet by mouth every 6 (six) hours as needed for moderate pain. Patient not taking: Reported on 11/29/2022 08/26/16   Cuthriell, Delorise Royals, PA-C  ibuprofen (ADVIL,MOTRIN) 200 MG tablet Take  200-600 mg by mouth every 6 (six) hours as needed. pain    [provider]  lisdexamfetamine (VYVANSE) 30 MG capsule Take 30 mg by mouth every morning.    [provider]  ondansetron (ZOFRAN-ODT) 4 MG disintegrating tablet Take 1 tablet (4 mg total) by mouth every 8 (eight) hours as needed for nausea or vomiting. 08/26/16    Cuthriell, Delorise Royals, PA-C  predniSONE (DELTASONE) 10 MG tablet Take 4 tablets (40 mg) day one, 3 tablets day two, 2 tablets day three, 1 tablet day four 06/03/11   Grant Fontana, PA-C      VITAL SIGNS:  Blood pressure 107/72, pulse 79, temperature (!) 97.4 F (36.3 C), temperature source Oral, resp. rate 18, height 5\' 6"  (1.676 m), weight 107.5 kg, last menstrual period 11/15/2022, SpO2 99%.  PHYSICAL EXAMINATION:  Physical Exam  GENERAL:  42 y.o.-year-old Caucasian female patient lying in the bed with no acute distress.  EYES: Pupils equal, round, reactive to light and accommodation. No scleral icterus.  Positive pallor.  Extraocular muscles intact.  HEENT: Head atraumatic, normocephalic. Oropharynx and nasopharynx clear.  NECK:  Supple, no jugular venous distention. No thyroid enlargement, no tenderness.  LUNGS: Normal breath sounds bilaterally, no wheezing, rales,rhonchi or crepitation. No use of accessory muscles of respiration.  CARDIOVASCULAR: Regular rate and rhythm, S1, S2 normal. No murmurs, rubs, or gallops.  ABDOMEN: Soft, nondistended, nontender. Bowel sounds present. No organomegaly or mass.  EXTREMITIES: No pedal edema, cyanosis, or clubbing.  NEUROLOGIC: Cranial nerves II through XII are intact. Muscle strength 5/5 in all extremities. Sensation intact. Gait not checked.  PSYCHIATRIC: The patient is alert and oriented x 3.  Normal affect and good eye contact. SKIN: No obvious rash, lesion, or ulcer.   LABORATORY PANEL:   CBC Recent Labs  Lab 11/29/22 1439  WBC 15.0*  HGB 6.6*  HCT 22.8*  PLT 463*   ------------------------------------------------------------------------------------------------------------------  Chemistries  Recent Labs  Lab 11/29/22 1439  NA 132*  K 3.8  CL 104  CO2 17*  GLUCOSE 138*  BUN 18  CREATININE 1.12*  CALCIUM 8.7*    ------------------------------------------------------------------------------------------------------------------  Cardiac Enzymes No results for input(s): "TROPONINI" in the last 168 hours. ------------------------------------------------------------------------------------------------------------------  RADIOLOGY:  US PELVIS (TRANSABDOMINAL ONLY)  Result Date: 11/29/2022 CLINICAL DATA:  Vaginal bleeding EXAM: TRANSABDOMINAL ULTRASOUND OF PELVIS TECHNIQUE: Transabdominal ultrasound examination of the pelvis was performed including evaluation of the uterus, ovaries, adnexal regions, and pelvic cul-de-sac. Transvaginal imaging was declined by the patient. COMPARISON:  None Available. FINDINGS: Uterus Measurements: 10.0 x 4.4 x 5.6 cm. = volume: 131 mL. No fibroids or other mass visualized. Endometrium Thickness: 7.8 mm.  No focal abnormality visualized. Right ovary Measurements: 3.0 x 1.9 x 2.1 cm. = volume: 6 mL. Normal appearance/no adnexal mass. Left ovary Measurements: 3.5 x 2.9 x 2.7 cm. = volume: 14 mL. Normal appearance/no adnexal mass. Other findings:  No abnormal free fluid. IMPRESSION: Unremarkable ultrasound of the pelvis. Electronically Signed   By: Alcide Clever M.D.   On: 11/29/2022 20:15   DG Chest 2 View  Result Date: 11/29/2022 CLINICAL DATA:  42 year old female with shortness of breath. Recently diagnosed with pneumonia. Not improving with prescribed medication. EXAM: CHEST - 2 VIEW COMPARISON:  Chest radiographs 03/26/2014 and earlier. FINDINGS: PA and lateral views at 1448 hours. Lung volumes and mediastinal contours have not significantly changed since 2016. Visualized tracheal air column is within normal limits. No pneumothorax, pleural effusion, consolidation. No confluent pulmonary opacity, lung markings are within normal  limits. Negative visible bowel gas and osseous structures. Stable cholecystectomy clips. IMPRESSION: No acute cardiopulmonary abnormality. Electronically  Signed   By: Odessa Fleming M.D.   On: 11/29/2022 16:47      IMPRESSION AND PLAN:  Assessment and Plan: * Acute blood loss anemia - This is likely secondary to prolonged vaginal bleeding with chronic blood loss. - The patient will be admitted to a medical telemetry observation bed. - Will follow posttransfusion H&H as well as serial levels. - GYN consult to be obtained. - I notified Dr. Dalbert Garnet about the patient.  Type 2 diabetes mellitus without complications (HCC) - We will place the patient on supplemental coverage with NovoLog. - We will hold off metformin.  Metabolic acidosis - This likely associated with volume depletion and exacerbated with mild hyponatremia. - She will be hydrated with IV normal saline and will be transfused 1 unit of packed red blood cells as mentioned above. - Will follow BMP.  Hypothyroidism - We will continue Synthroid.  Anxiety - We will continue Klonopin.   DVT prophylaxis: SCDs.  Medical prophylaxis currently contraindicated due to acute blood loss anemia. Advanced Care Planning:  Code Status: full code. Family Communication:  The plan of care was discussed in details with the patient (and family). I answered all questions. The patient agreed to proceed with the above mentioned plan. Further management will depend upon hospital course. Disposition Plan: Back to previous home environment Consults called: Gyn All the records are reviewed and case discussed with ED provider.  Status is: Observation   I certify that at the time of admission, it is my clinical judgment that the patient will require hospital care extending less than 2 midnights.                            Dispo: The patient is from: Home              Anticipated d/c is to: Home              Patient currently is not medically stable to d/c.              Difficult to place patient: No  Hannah Beat M.D on 11/29/2022 at 10:34 PM  [facility]   From 7 PM-7 AM, contact  night-coverage [email or web address]  CC: Primary care physician; [facility]

## 2022-11-29 NOTE — Assessment & Plan Note (Addendum)
Likely associated with volume depletion and exacerbated with mild hyponatremia. Pt was hydrated with IV normal saline, sodium improved. --BMP at follow up

## 2022-11-29 NOTE — ED Triage Notes (Signed)
Pt sts that she was dx with pneumonia last week and has been taking her prescribed abx however she sts that she is getting SOB with walking and is having left arm numbness.

## 2022-11-30 ENCOUNTER — Other Ambulatory Visit: Payer: Self-pay | Admitting: Internal Medicine

## 2022-11-30 DIAGNOSIS — D62 Acute posthemorrhagic anemia: Secondary | ICD-10-CM | POA: Diagnosis not present

## 2022-11-30 DIAGNOSIS — D649 Anemia, unspecified: Secondary | ICD-10-CM | POA: Insufficient documentation

## 2022-11-30 LAB — BASIC METABOLIC PANEL
Anion gap: 10 (ref 5–15)
BUN: 16 mg/dL (ref 6–20)
CO2: 19 mmol/L — ABNORMAL LOW (ref 22–32)
Calcium: 8.5 mg/dL — ABNORMAL LOW (ref 8.9–10.3)
Chloride: 106 mmol/L (ref 98–111)
Creatinine, Ser: 0.75 mg/dL (ref 0.44–1.00)
GFR, Estimated: >60 mL/min (ref >60)
Glucose, Bld: 126 mg/dL — ABNORMAL HIGH (ref 70–99)
Potassium: 3.5 mmol/L (ref 3.5–5.1)
Sodium: 135 mmol/L (ref 135–145)

## 2022-11-30 LAB — CBC
HCT: 23 % — ABNORMAL LOW (ref 36.0–46.0)
Hemoglobin: 7.1 g/dL — ABNORMAL LOW (ref 12.0–15.0)
MCH: 20.8 pg — ABNORMAL LOW (ref 26.0–34.0)
MCHC: 30.9 g/dL (ref 30.0–36.0)
MCV: 67.3 fL — ABNORMAL LOW (ref 80.0–100.0)
Platelets: 380 10*3/uL (ref 150–400)
RBC: 3.42 MIL/uL — ABNORMAL LOW (ref 3.87–5.11)
RDW: 19.3 % — ABNORMAL HIGH (ref 11.5–15.5)
WBC: 12.3 10*3/uL — ABNORMAL HIGH (ref 4.0–10.5)
nRBC: 0.3 % — ABNORMAL HIGH (ref 0.0–0.2)

## 2022-11-30 LAB — GLUCOSE, CAPILLARY
Glucose-Capillary: 120 mg/dL — ABNORMAL HIGH (ref 70–99)
Glucose-Capillary: 123 mg/dL — ABNORMAL HIGH (ref 70–99)
Glucose-Capillary: 162 mg/dL — ABNORMAL HIGH (ref 70–99)

## 2022-11-30 LAB — HEMOGLOBIN AND HEMATOCRIT, BLOOD
HCT: 23.1 % — ABNORMAL LOW (ref 36.0–46.0)
HCT: 26.7 % — ABNORMAL LOW (ref 36.0–46.0)
Hemoglobin: 7.1 g/dL — ABNORMAL LOW (ref 12.0–15.0)
Hemoglobin: 8.4 g/dL — ABNORMAL LOW (ref 12.0–15.0)

## 2022-11-30 LAB — PREPARE RBC (CROSSMATCH)

## 2022-11-30 LAB — HEMOGLOBIN A1C
Hgb A1c MFr Bld: 7.1 % — ABNORMAL HIGH (ref 4.8–5.6)
Mean Plasma Glucose: 157.07 mg/dL

## 2022-11-30 LAB — HIV ANTIBODY (ROUTINE TESTING W REFLEX): HIV Screen 4th Generation wRfx: NONREACTIVE

## 2022-11-30 LAB — IRON AND TIBC
Iron: 28 ug/dL (ref 28–170)
Saturation Ratios: 6 % — ABNORMAL LOW (ref 10.4–31.8)
TIBC: 463 ug/dL — ABNORMAL HIGH (ref 250–450)
UIBC: 435 ug/dL

## 2022-11-30 LAB — FERRITIN: Ferritin: 5 ng/mL — ABNORMAL LOW (ref 11–307)

## 2022-11-30 MED ORDER — INFLUENZA VIRUS VACC SPLIT PF (FLUZONE) 0.5 ML IM SUSY
0.5000 mL | PREFILLED_SYRINGE | INTRAMUSCULAR | Status: AC
  Administered 2022-11-30: 0.5 mL via INTRAMUSCULAR
  Filled 2022-11-30: qty 0.5

## 2022-11-30 MED ORDER — INFLUENZA VIRUS VACC SPLIT PF (FLUZONE) 0.5 ML IM SUSY: 0.5000 mL | PREFILLED_SYRINGE | INTRAMUSCULAR | Status: DC

## 2022-11-30 MED ORDER — LEVOTHYROXINE SODIUM 88 MCG PO TABS: 88.0000 ug | ORAL_TABLET | Freq: Every day | ORAL | Status: DC

## 2022-11-30 MED ORDER — CLONAZEPAM 0.5 MG PO TABS
0.5000 mg | ORAL_TABLET | Freq: Two times a day (BID) | ORAL | Status: DC | PRN
  Administered 2022-11-30: 0.5 mg via ORAL
  Filled 2022-11-30: qty 1

## 2022-11-30 MED ORDER — SODIUM CHLORIDE 0.9% IV SOLUTION: Freq: Once | INTRAVENOUS | Status: AC

## 2022-11-30 NOTE — Plan of Care (Signed)
  Problem: Education: Goal: Ability to describe self-care measures that may prevent or decrease complications (Diabetes Survival Skills Education) will improve Outcome: Progressing   Problem: Coping: Goal: Ability to adjust to condition or change in health will improve Outcome: Progressing   Problem: Health Behavior/Discharge Planning: Goal: Ability to identify and utilize available resources and services will improve Outcome: Progressing   

## 2022-11-30 NOTE — Plan of Care (Signed)
  Problem: Coping: Goal: Ability to adjust to condition or change in health will improve Outcome: Progressing   Problem: Clinical Measurements: Goal: Ability to maintain clinical measurements within normal limits will improve Outcome: Progressing   Problem: Clinical Measurements: Goal: Cardiovascular complication will be avoided Outcome: Progressing   Problem: Clinical Measurements: Goal: Respiratory complications will improve Outcome: Progressing   Problem: Pain Managment: Goal: General experience of comfort will improve Outcome: Progressing   Problem: Safety: Goal: Ability to remain free from injury will improve Outcome: Progressing

## 2022-11-30 NOTE — Discharge Summary (Signed)
Physician Discharge Summary   Patient: Brittany Keller MRN: [id number] DOB: 1980-10-09  Admit date:     11/29/2022  Discharge date: 11/30/2022  Discharge Physician: Pennie Banter   PCP: [facility]   Recommendations at discharge:  {Tip this will not be part of the note when signed- Example include specific recommendations for outpatient follow-up, pending tests to follow-up on. (Optional):26781}  Follow up with Hematology Follow up with Primary Care Repeat CBC at follow up   Discharge Diagnoses: Principal Problem:   Acute blood loss anemia Active Problems:   Type 2 diabetes mellitus without complications (HCC)   Metabolic acidosis   Anxiety   Hypothyroidism   Vaginal bleeding  Resolved Problems:   * No resolved hospital problems. [facility] Course: No notes on file  Assessment and Plan: * Acute blood loss anemia - This is likely secondary to prolonged vaginal bleeding with chronic blood loss. - The patient will be admitted to a medical telemetry observation bed. - Will follow posttransfusion H&H as well as serial levels. - GYN consult to be obtained. - I notified Dr. Dalbert Garnet about the patient.  Type 2 diabetes mellitus without complications (HCC) - We will place the patient on supplemental coverage with NovoLog. - We will hold off metformin.  Metabolic acidosis - This likely associated with volume depletion and exacerbated with mild hyponatremia. - She will be hydrated with IV normal saline and will be transfused 1 unit of packed red blood cells as mentioned above. - Will follow BMP.  Hypothyroidism - We will continue Synthroid.  Anxiety - We will continue Klonopin.      {Tip this will not be part of the note when signed Body mass index is 38.25 kg/m. , ,  (Optional):26781}  {(NOTE) Pain control PDMP Statment (Optional):26782} Consultants: *** Procedures performed: ***  Disposition: {Plan; Disposition:26390} Diet recommendation:   Discharge Diet Orders (From admission, onward)     Start     Ordered   11/30/22 0000  Diet - low sodium heart healthy        11/30/22 1811           {Diet_Plan:26776} DISCHARGE MEDICATION: Allergies as of 11/30/2022   No Known Allergies      Medication List     STOP taking these medications    amoxicillin-clavulanate 875-125 MG tablet Commonly known as: AUGMENTIN   doxycycline 100 MG tablet Commonly known as: VIBRA-TABS   ondansetron 4 MG disintegrating tablet Commonly known as: ZOFRAN-ODT   predniSONE 10 MG tablet Commonly known as: DELTASONE   sulfamethoxazole-trimethoprim 800-160 MG tablet Commonly known as: BACTRIM DS       TAKE these medications    clindamycin 150 MG capsule Commonly known as: CLEOCIN Take 150 mg by mouth 2 (two) times daily as needed.   clobetasol cream 0.05 % Commonly known as: TEMOVATE Apply 1 Application topically 2 (two) times daily.   clonazePAM 0.5 MG tablet Commonly known as: KLONOPIN Take 0.5 mg by mouth 2 (two) times daily as needed for anxiety.   fluconazole 150 MG tablet Commonly known as: DIFLUCAN Take 150 mg by mouth every 3 (three) days.   levothyroxine 88 MCG tablet Commonly known as: SYNTHROID Take 88 mcg by mouth every morning.   metFORMIN 1000 MG tablet Commonly known as: GLUCOPHAGE Take 500 mg by mouth 2 (two) times daily.   ondansetron 4 MG tablet Commonly known as: ZOFRAN Take 4 mg by mouth every 8 (eight) hours as needed.  Follow-up Information     Michaelyn Barter, MD. Call.   Specialty: Oncology Why: Hematology follow up for anemia monitoring and management Contact information: [address] [place] [address] [phone]                Discharge Exam: Ceasar Mons Weights   11/29/22 1436  Weight: 107.5 kg   ***  Condition at discharge: {DC Condition:26389}  The results of significant diagnostics from this hospitalization (including imaging, microbiology,  ancillary and laboratory) are listed below for reference.   Imaging Studies: US PELVIS (TRANSABDOMINAL ONLY)  Result Date: 11/29/2022 CLINICAL DATA:  Vaginal bleeding EXAM: TRANSABDOMINAL ULTRASOUND OF PELVIS TECHNIQUE: Transabdominal ultrasound examination of the pelvis was performed including evaluation of the uterus, ovaries, adnexal regions, and pelvic cul-de-sac. Transvaginal imaging was declined by the patient. COMPARISON:  None Available. FINDINGS: Uterus Measurements: 10.0 x 4.4 x 5.6 cm. = volume: 131 mL. No fibroids or other mass visualized. Endometrium Thickness: 7.8 mm.  No focal abnormality visualized. Right ovary Measurements: 3.0 x 1.9 x 2.1 cm. = volume: 6 mL. Normal appearance/no adnexal mass. Left ovary Measurements: 3.5 x 2.9 x 2.7 cm. = volume: 14 mL. Normal appearance/no adnexal mass. Other findings:  No abnormal free fluid. IMPRESSION: Unremarkable ultrasound of the pelvis. Electronically Signed   By: Alcide Clever M.D.   On: 11/29/2022 20:15   DG Chest 2 View  Result Date: 11/29/2022 CLINICAL DATA:  42 year old female with shortness of breath. Recently diagnosed with pneumonia. Not improving with prescribed medication. EXAM: CHEST - 2 VIEW COMPARISON:  Chest radiographs 03/26/2014 and earlier. FINDINGS: PA and lateral views at 1448 hours. Lung volumes and mediastinal contours have not significantly changed since 2016. Visualized tracheal air column is within normal limits. No pneumothorax, pleural effusion, consolidation. No confluent pulmonary opacity, lung markings are within normal limits. Negative visible bowel gas and osseous structures. Stable cholecystectomy clips. IMPRESSION: No acute cardiopulmonary abnormality. Electronically Signed   By: Odessa Fleming M.D.   On: 11/29/2022 16:47    Microbiology: Results for orders placed or performed in visit on 03/09/19  Novel Coronavirus, NAA ([facility])     Status: Abnormal   Collection Time: 03/09/19 10:29 AM   Specimen: Nasopharyngeal(NP)  swabs in vial transport medium   NASOPHARYNGE  TESTING  Result Value Ref Range Status   SARS-CoV-2, NAA Detected (A) Not Detected Final    Comment: This nucleic acid amplification test was developed and its performance characteristics determined by [employer]. Nucleic acid amplification tests include PCR and TMA. This test has not been FDA cleared or approved. This test has been authorized by FDA under an Emergency Use Authorization (EUA). This test is only authorized for the duration of time the declaration that circumstances exist justifying the authorization of the emergency use of in vitro diagnostic tests for detection of SARS-CoV-2 virus and/or diagnosis of COVID-19 infection under section 564(b)(1) of the Act, 21 U.S.C. 914NWG-9(F) (1), unless the authorization is terminated or revoked sooner. When diagnostic testing is negative, the possibility of a false negative result should be considered in the context of a patient's recent exposures and the presence of clinical signs and symptoms consistent with COVID-19. An individual without symptoms of COVID-19 and who is not shedding SARS-CoV-2 virus would  expect to have a negative (not detected) result in this assay.     Labs: CBC: Recent Labs  Lab 11/29/22 1439 11/29/22 2333 11/30/22 0414 11/30/22 1003 11/30/22 1719  WBC 15.0*  --  12.3*  --   --  HGB 6.6* 6.8* 7.1* 7.1* 8.4*  HCT 22.8* 22.7* 23.0* 23.1* 26.7*  MCV 66.7*  --  67.3*  --   --   PLT 463*  --  380  --   --    Basic Metabolic Panel: Recent Labs  Lab 11/29/22 1439 11/30/22 0414  NA 132* 135  K 3.8 3.5  CL 104 106  CO2 17* 19*  GLUCOSE 138* 126*  BUN 18 16  CREATININE 1.12* 0.75  CALCIUM 8.7* 8.5*   Liver Function Tests: No results for input(s): "AST", "ALT", "ALKPHOS", "BILITOT", "PROT", "ALBUMIN" in the last 168 hours. CBG: Recent Labs  Lab 11/29/22 2334 11/30/22 0844 11/30/22 1150 11/30/22 1631  GLUCAP 106* 120* 123* 162*     Discharge time spent: {LESS THAN/GREATER THAN:26388} 30 minutes.  Signed: Pennie Banter, DO [facility] 11/30/2022

## 2022-12-01 LAB — TYPE AND SCREEN
ABO/RH(D): B POS
Antibody Screen: NEGATIVE
Unit division: 0
Unit division: 0

## 2022-12-01 LAB — BPAM RBC
Blood Product Expiration Date: 202411012359
Blood Product Expiration Date: 202411052359
ISSUE DATE / TIME: 202410031756
ISSUE DATE / TIME: 202410041213
Unit Type and Rh: 7300
Unit Type and Rh: 7300

## 2022-12-03 NOTE — Assessment & Plan Note (Signed)
Follow up with Gynecology out patient

## 2022-12-07 ENCOUNTER — Inpatient Hospital Stay: Payer: Medicaid Other | Attending: Internal Medicine | Admitting: Internal Medicine

## 2022-12-07 ENCOUNTER — Inpatient Hospital Stay: Payer: Medicaid Other

## 2022-12-07 ENCOUNTER — Encounter: Payer: Self-pay | Admitting: Internal Medicine

## 2022-12-07 VITALS — BP 120/71 | HR 75

## 2022-12-07 VITALS — BP 124/66 | HR 80 | Temp 98.6°F | Wt 268.0 lb

## 2022-12-07 DIAGNOSIS — M542 Cervicalgia: Secondary | ICD-10-CM | POA: Diagnosis not present

## 2022-12-07 DIAGNOSIS — D649 Anemia, unspecified: Secondary | ICD-10-CM

## 2022-12-07 DIAGNOSIS — N939 Abnormal uterine and vaginal bleeding, unspecified: Secondary | ICD-10-CM | POA: Diagnosis not present

## 2022-12-07 DIAGNOSIS — D500 Iron deficiency anemia secondary to blood loss (chronic): Secondary | ICD-10-CM | POA: Insufficient documentation

## 2022-12-07 DIAGNOSIS — D509 Iron deficiency anemia, unspecified: Secondary | ICD-10-CM | POA: Insufficient documentation

## 2022-12-07 LAB — CBC WITH DIFFERENTIAL/PLATELET
Abs Immature Granulocytes: 0.03 10*3/uL (ref 0.00–0.07)
Basophils Absolute: 0.1 10*3/uL (ref 0.0–0.1)
Basophils Relative: 1 %
Eosinophils Absolute: 0.3 10*3/uL (ref 0.0–0.5)
Eosinophils Relative: 3 %
HCT: 27.4 % — ABNORMAL LOW (ref 36.0–46.0)
Hemoglobin: 8.1 g/dL — ABNORMAL LOW (ref 12.0–15.0)
Immature Granulocytes: 0 %
Lymphocytes Relative: 20 %
Lymphs Abs: 1.8 10*3/uL (ref 0.7–4.0)
MCH: 22 pg — ABNORMAL LOW (ref 26.0–34.0)
MCHC: 29.6 g/dL — ABNORMAL LOW (ref 30.0–36.0)
MCV: 74.5 fL — ABNORMAL LOW (ref 80.0–100.0)
Monocytes Absolute: 0.6 10*3/uL (ref 0.1–1.0)
Monocytes Relative: 7 %
Neutro Abs: 6.2 10*3/uL (ref 1.7–7.7)
Neutrophils Relative %: 69 %
Platelets: 255 10*3/uL (ref 150–400)
RBC: 3.68 MIL/uL — ABNORMAL LOW (ref 3.87–5.11)
RDW: 23.4 % — ABNORMAL HIGH (ref 11.5–15.5)
WBC: 9 10*3/uL (ref 4.0–10.5)
nRBC: 0 % (ref 0.0–0.2)

## 2022-12-07 LAB — FOLATE: Folate: 30 ng/mL (ref >5.9)

## 2022-12-07 LAB — VITAMIN B12: Vitamin B-12: 2062 pg/mL — ABNORMAL HIGH (ref 180–914)

## 2022-12-07 MED ORDER — SODIUM CHLORIDE 0.9 % IV SOLN
Freq: Once | INTRAVENOUS | Status: AC
  Filled 2022-12-07: qty 250

## 2022-12-07 MED ORDER — SODIUM CHLORIDE 0.9 % IV SOLN
200.0000 mg | Freq: Once | INTRAVENOUS | Status: AC
  Administered 2022-12-07: 200 mg via INTRAVENOUS
  Filled 2022-12-07: qty 200

## 2022-12-07 MED ORDER — FERROUS GLUCONATE 324 (38 FE) MG PO TABS
324.0000 mg | ORAL_TABLET | ORAL | 3 refills | Status: DC
Start: 2022-12-07 — End: 2022-12-27

## 2022-12-07 NOTE — Progress Notes (Signed)
[facility]  [phone] [phone]) [phone]  ID: Brittany Keller OB: October 07, 1980  MR#: [id number]  ONG#:[id number]  Patient Care Team: [facility] as PCP - General  REFERRING PROVIDER: Dr. Denton Lank  REASON FOR REFERRAL: Iron deficiency anemia  HPI: Brittany Keller is a 42 y.o. female with past medical history of anxiety referred to hematology for management of IDA.  Patient presented to ER in October 2024 with worsening shortness of breath and dizziness.  Was found to have hemoglobin of 6.6.  Received 2 units PRBC. Ferritin of 5.  She reports vaginal bleeding which started about 4 months ago.  Would stop for 2 to 3 days and then would start again.  She has had normal menstrual cycles since menses.  Her bleeding stopped last month.  Reports shortness of breath on exertion and dizziness.  Patient reports pain in the left side of the neck radiating down in her arm.  REVIEW OF SYSTEMS:   ROS  As per HPI. Otherwise, a complete review of systems is negative.  PAST MEDICAL HISTORY: Past Medical History:  Diagnosis Date   Anxiety    COVID-19     PAST SURGICAL HISTORY: History reviewed. No pertinent surgical history.  FAMILY HISTORY: No family history on file.  HEALTH MAINTENANCE: Social History   Tobacco Use   Smoking status: Every Day    Types: Cigarettes   Smokeless tobacco: Never  Vaping Use   Vaping status: Never Used  Substance Use Topics   Alcohol use: No   Drug use: No     No Known Allergies  Current Outpatient Medications  Medication Sig Dispense Refill   clindamycin (CLEOCIN) 150 MG capsule Take 150 mg by mouth 2 (two) times daily as needed.     clobetasol cream (TEMOVATE) 0.05 % Apply 1 Application topically 2 (two) times daily.     clonazePAM (KLONOPIN) 0.5 MG tablet Take 0.5 mg by mouth 2 (two) times daily as needed for anxiety.     fluconazole (DIFLUCAN) 150 MG tablet Take 150 mg by mouth every 3  (three) days.     levothyroxine (SYNTHROID) 88 MCG tablet Take 88 mcg by mouth every morning.     metFORMIN (GLUCOPHAGE) 1000 MG tablet Take 500 mg by mouth 2 (two) times daily.     ondansetron (ZOFRAN) 4 MG tablet Take 4 mg by mouth every 8 (eight) hours as needed.     No current facility-administered medications for this visit.    OBJECTIVE: There were no vitals filed for this visit.   There is no height or weight on file to calculate BMI.      General: Well-developed, well-nourished, no acute distress. Eyes: Pink conjunctiva, anicteric sclera. HEENT: Normocephalic, moist mucous membranes, clear oropharnyx. Lungs: Clear to auscultation bilaterally. Heart: Regular rate and rhythm. No rubs, murmurs, or gallops. Abdomen: Soft, nontender, nondistended. No organomegaly noted, normoactive bowel sounds. Musculoskeletal: No edema, cyanosis, or clubbing. Neuro: Alert, answering all questions appropriately. Cranial nerves grossly intact. Skin: No rashes or petechiae noted. Psych: Normal affect. Lymphatics: No cervical, calvicular, axillary or inguinal LAD.   LAB RESULTS:  Lab Results  Component Value Date   NA 135 11/30/2022   K 3.5 11/30/2022   CL 106 11/30/2022   CO2 19 (L) 11/30/2022   GLUCOSE 126 (H) 11/30/2022   BUN 16 11/30/2022   CREATININE 0.75 11/30/2022   CALCIUM 8.5 (L) 11/30/2022   PROT 7.4 09/30/2018   ALBUMIN 4.0 09/30/2018   AST 27 09/30/2018  ALT 29 09/30/2018   ALKPHOS 55 09/30/2018   BILITOT 0.7 09/30/2018   GFRNONAA >60 11/30/2022   GFRAA >60 09/30/2018    Lab Results  Component Value Date   WBC 9.0 12/07/2022   NEUTROABS 6.2 12/07/2022   HGB 8.1 (L) 12/07/2022   HCT 27.4 (L) 12/07/2022   MCV 74.5 (L) 12/07/2022   PLT 255 12/07/2022    Lab Results  Component Value Date   TIBC 463 (H) 11/30/2022   FERRITIN 5 (L) 11/30/2022   IRONPCTSAT 6 (L) 11/30/2022     STUDIES: US PELVIS (TRANSABDOMINAL ONLY)  Result Date: 11/29/2022 CLINICAL DATA:   Vaginal bleeding EXAM: TRANSABDOMINAL ULTRASOUND OF PELVIS TECHNIQUE: Transabdominal ultrasound examination of the pelvis was performed including evaluation of the uterus, ovaries, adnexal regions, and pelvic cul-de-sac. Transvaginal imaging was declined by the patient. COMPARISON:  None Available. FINDINGS: Uterus Measurements: 10.0 x 4.4 x 5.6 cm. = volume: 131 mL. No fibroids or other mass visualized. Endometrium Thickness: 7.8 mm.  No focal abnormality visualized. Right ovary Measurements: 3.0 x 1.9 x 2.1 cm. = volume: 6 mL. Normal appearance/no adnexal mass. Left ovary Measurements: 3.5 x 2.9 x 2.7 cm. = volume: 14 mL. Normal appearance/no adnexal mass. Other findings:  No abnormal free fluid. IMPRESSION: Unremarkable ultrasound of the pelvis. Electronically Signed   By: Alcide Clever M.D.   On: 11/29/2022 20:15   DG Chest 2 View  Result Date: 11/29/2022 CLINICAL DATA:  42 year old female with shortness of breath. Recently diagnosed with pneumonia. Not improving with prescribed medication. EXAM: CHEST - 2 VIEW COMPARISON:  Chest radiographs 03/26/2014 and earlier. FINDINGS: PA and lateral views at 1448 hours. Lung volumes and mediastinal contours have not significantly changed since 2016. Visualized tracheal air column is within normal limits. No pneumothorax, pleural effusion, consolidation. No confluent pulmonary opacity, lung markings are within normal limits. Negative visible bowel gas and osseous structures. Stable cholecystectomy clips. IMPRESSION: No acute cardiopulmonary abnormality. Electronically Signed   By: Odessa Fleming M.D.   On: 11/29/2022 16:47    ASSESSMENT AND PLAN:   Brittany Keller is a 42 y.o. female with pmh of anxiety referred to hematology for management of IDA.  #Iron deficiency anemia  #Abnormal uterine bleeding - Patient presented to ER in October 2024 with worsening shortness of breath and dizziness.  Was found to have hemoglobin of 6.6.  Received 2 units PRBC.  Ferritin of  5. -Patient reports heavy vaginal bleeding started 4 months ago.  Would stop for 2 to 3 days and start again.  US pelvis was unremarkable.  Her vaginal bleeding is stopped now.  But would still make referral to OB/GYN to further evaluate for abnormal uterine bleeding. -I discussed about Venofer weekly x 5 doses.  Side effects such as nausea, back pain, chest pain was discussed.  Rarely can cause allergic reaction.  She is agreeable to proceed.  Will give her first dose today.  Start oral iron 3 times a week with vitamin C with breakfast.  Will also add vitamin B12 and folate to complete the workup for anemia. -I also discussed about other etiology for iron deficiency anemia such as GI bleed.  Denies any bleeding in urine or stool.  Patient prefers to finish iron infusion and get evaluated for vaginal bleeding first.  Then would consider GI workup.  # Neck pain radiating to arm -Patient is in process of establishing with primary care.  Will defer further imaging to primary.  Orders Placed This Encounter  Procedures  Folate   CBC with Differential/Platelet   Ferritin   Iron and TIBC([facility])   Ambulatory referral to Obstetrics / Gynecology   Hold Tube- Blood Bank   IV Venofer weekly x 4 doses RTC in 10 weeks for MD visit, labs, possible Venofer  Patient expressed understanding and was in agreement with this plan. She also understands that She can call clinic at any time with any questions, concerns, or complaints.   I spent a total of 45 minutes reviewing chart data, face-to-face evaluation with the patient, counseling and coordination of care as detailed above.  Michaelyn Barter, MD   12/07/2022 11:03 AM

## 2022-12-07 NOTE — Progress Notes (Signed)
Patient is having severe sharp pain which starts at her left side of her neck and radiates down her left arm, she states that this started at the beginning of this month(11/27/2022). She feels fatigued, and more tired. 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

## 2022-12-07 NOTE — Patient Instructions (Signed)
Iron Sucrose Injection What is this medication? IRON SUCROSE (EYE ern SOO krose) treats low levels of iron (iron deficiency anemia) in people with kidney disease. Iron is a mineral that plays an important role in making red blood cells, which carry oxygen from your lungs to the rest of your body. This medicine may be used for other purposes; ask your health care provider or pharmacist if you have questions. COMMON BRAND NAME(S): Venofer What should I tell my care team before I take this medication? They need to know if you have any of these conditions: Anemia not caused by low iron levels Heart disease High levels of iron in the blood Kidney disease Liver disease An unusual or allergic reaction to iron, other medications, foods, dyes, or preservatives Pregnant or trying to get pregnant Breastfeeding How should I use this medication? This medication is for infusion into a vein. It is given in a hospital or clinic setting. Talk to your care team about the use of this medication in children. While this medication may be prescribed for children as young as 2 years for selected conditions, precautions do apply. Overdosage: If you think you have taken too much of this medicine contact a poison control center or emergency room at once. NOTE: This medicine is only for you. Do not share this medicine with others. What if I miss a dose? Keep appointments for follow-up doses. It is important not to miss your dose. Call your care team if you are unable to keep an appointment. What may interact with this medication? Do not take this medication with any of the following: Deferoxamine Dimercaprol Other iron products This medication may also interact with the following: Chloramphenicol Deferasirox This list may not describe all possible interactions. Give your health care provider a list of all the medicines, herbs, non-prescription drugs, or dietary supplements you use. Also tell them if you smoke,  drink alcohol, or use illegal drugs. Some items may interact with your medicine. What should I watch for while using this medication? Visit your care team regularly. Tell your care team if your symptoms do not start to get better or if they get worse. You may need blood work done while you are taking this medication. You may need to follow a special diet. Talk to your care team. Foods that contain iron include: whole grains/cereals, dried fruits, beans, or peas, leafy green vegetables, and organ meats (liver, kidney). What side effects may I notice from receiving this medication? Side effects that you should report to your care team as soon as possible: Allergic reactions--skin rash, itching, hives, swelling of the face, lips, tongue, or throat Low blood pressure--dizziness, feeling faint or lightheaded, blurry vision Shortness of breath Side effects that usually do not require medical attention (report to your care team if they continue or are bothersome): Flushing Headache Joint pain Muscle pain Nausea Pain, redness, or irritation at injection site This list may not describe all possible side effects. Call your doctor for medical advice about side effects. You may report side effects to FDA at 1-800-FDA-1088. Where should I keep my medication? This medication is given in a hospital or clinic. It will not be stored at home. NOTE: This sheet is a summary. It may not cover all possible information. If you have questions about this medicine, talk to your doctor, pharmacist, or health care provider.  2024 Elsevier/Gold Standard (2022-07-20 00:00:00)

## 2022-12-11 ENCOUNTER — Inpatient Hospital Stay: Payer: Medicaid Other

## 2022-12-11 VITALS — BP 137/60 | HR 82 | Temp 97.1°F | Resp 18

## 2022-12-11 DIAGNOSIS — D509 Iron deficiency anemia, unspecified: Secondary | ICD-10-CM | POA: Diagnosis not present

## 2022-12-11 DIAGNOSIS — D649 Anemia, unspecified: Secondary | ICD-10-CM

## 2022-12-11 MED ORDER — SODIUM CHLORIDE 0.9 % IV SOLN
Freq: Once | INTRAVENOUS | Status: AC
  Filled 2022-12-11: qty 250

## 2022-12-11 MED ORDER — SODIUM CHLORIDE 0.9 % IV SOLN
200.0000 mg | Freq: Once | INTRAVENOUS | Status: AC
  Administered 2022-12-11: 200 mg via INTRAVENOUS
  Filled 2022-12-11: qty 200

## 2022-12-11 NOTE — Patient Instructions (Signed)
Iron Sucrose Injection What is this medication? IRON SUCROSE (EYE ern SOO krose) treats low levels of iron (iron deficiency anemia) in people with kidney disease. Iron is a mineral that plays an important role in making red blood cells, which carry oxygen from your lungs to the rest of your body. This medicine may be used for other purposes; ask your health care provider or pharmacist if you have questions. COMMON BRAND NAME(S): Venofer What should I tell my care team before I take this medication? They need to know if you have any of these conditions: Anemia not caused by low iron levels Heart disease High levels of iron in the blood Kidney disease Liver disease An unusual or allergic reaction to iron, other medications, foods, dyes, or preservatives Pregnant or trying to get pregnant Breastfeeding How should I use this medication? This medication is for infusion into a vein. It is given in a hospital or clinic setting. Talk to your care team about the use of this medication in children. While this medication may be prescribed for children as young as 2 years for selected conditions, precautions do apply. Overdosage: If you think you have taken too much of this medicine contact a poison control center or emergency room at once. NOTE: This medicine is only for you. Do not share this medicine with others. What if I miss a dose? Keep appointments for follow-up doses. It is important not to miss your dose. Call your care team if you are unable to keep an appointment. What may interact with this medication? Do not take this medication with any of the following: Deferoxamine Dimercaprol Other iron products This medication may also interact with the following: Chloramphenicol Deferasirox This list may not describe all possible interactions. Give your health care provider a list of all the medicines, herbs, non-prescription drugs, or dietary supplements you use. Also tell them if you smoke,  drink alcohol, or use illegal drugs. Some items may interact with your medicine. What should I watch for while using this medication? Visit your care team regularly. Tell your care team if your symptoms do not start to get better or if they get worse. You may need blood work done while you are taking this medication. You may need to follow a special diet. Talk to your care team. Foods that contain iron include: whole grains/cereals, dried fruits, beans, or peas, leafy green vegetables, and organ meats (liver, kidney). What side effects may I notice from receiving this medication? Side effects that you should report to your care team as soon as possible: Allergic reactions--skin rash, itching, hives, swelling of the face, lips, tongue, or throat Low blood pressure--dizziness, feeling faint or lightheaded, blurry vision Shortness of breath Side effects that usually do not require medical attention (report to your care team if they continue or are bothersome): Flushing Headache Joint pain Muscle pain Nausea Pain, redness, or irritation at injection site This list may not describe all possible side effects. Call your doctor for medical advice about side effects. You may report side effects to FDA at 1-800-FDA-1088. Where should I keep my medication? This medication is given in a hospital or clinic. It will not be stored at home. NOTE: This sheet is a summary. It may not cover all possible information. If you have questions about this medicine, talk to your doctor, pharmacist, or health care provider.  2024 Elsevier/Gold Standard (2022-07-20 00:00:00)

## 2022-12-17 ENCOUNTER — Encounter: Payer: Self-pay | Admitting: Obstetrics & Gynecology

## 2022-12-17 ENCOUNTER — Ambulatory Visit (INDEPENDENT_AMBULATORY_CARE_PROVIDER_SITE_OTHER): Payer: Medicaid Other | Admitting: Obstetrics & Gynecology

## 2022-12-17 ENCOUNTER — Other Ambulatory Visit (HOSPITAL_COMMUNITY)
Admission: RE | Admit: 2022-12-17 | Discharge: 2022-12-17 | Disposition: A | Discharge status: Home / Self Care | Payer: Medicaid Other | Source: Ambulatory Visit | Attending: Obstetrics & Gynecology | Admitting: Obstetrics & Gynecology

## 2022-12-17 VITALS — BP 127/87 | HR 73 | Ht 66.0 in | Wt 267.6 lb

## 2022-12-17 DIAGNOSIS — Z01419 Encounter for gynecological examination (general) (routine) without abnormal findings: Secondary | ICD-10-CM | POA: Diagnosis not present

## 2022-12-17 DIAGNOSIS — Z124 Encounter for screening for malignant neoplasm of cervix: Secondary | ICD-10-CM

## 2022-12-17 DIAGNOSIS — Z7689 Persons encountering health services in other specified circumstances: Secondary | ICD-10-CM

## 2022-12-17 DIAGNOSIS — N939 Abnormal uterine and vaginal bleeding, unspecified: Secondary | ICD-10-CM | POA: Diagnosis not present

## 2022-12-17 DIAGNOSIS — D500 Iron deficiency anemia secondary to blood loss (chronic): Secondary | ICD-10-CM

## 2022-12-17 DIAGNOSIS — Z1231 Encounter for screening mammogram for malignant neoplasm of breast: Secondary | ICD-10-CM

## 2022-12-17 NOTE — Progress Notes (Signed)
GYNECOLOGY ANNUAL PHYSICAL EXAM PROGRESS NOTE  Subjective:    Brittany Keller is a 42 y.o.  single [id number] who presents for an annual exam as well as follow up from ED visit 11/30/2022. She is new to this office.  She went to the ED 2 weeks ago feeling weak and SOB. Her HBG was 6.6. She was transfused 2 units of PRBCs and referred to hematology. She has received 3 of 6 planned IV iron infusions. She had normal monthly periods until about 6 months ago. She bled almost daily heavily from April until September. By the time sh went to the ER, her bleeding had stopped about 2 weeks prior. It has not returned. She has night sweats and hot flashes. She has not had sex for 3 years.    The patient participates in regular exercise: no. Has the patient ever been transfused or tattooed?: yes. The patient reports that there is not domestic violence in her life.   Menstrual History:  Patient's last menstrual period was 11/15/2022 (exact date).     Gynecologic History:  Contraception: abstinence History of STI's:  Last Pap: "years ago" No history of a mammogram.  FH- no breast, gyn, colon cancers, + pancreas ca in her maternal GF     OB History  Gravida Para Term Preterm AB Living  9 0 0 0 2 3  SAB IAB Ectopic Multiple Live Births  2 0 0 0 3    # Outcome Date GA Lbr Len/2nd Weight Sex Type Anes PTL Lv  9 Gravida           8 Gravida           7 Gravida           6 Gravida           5 Gravida           4 Gravida           3 Gravida         FD  2 SAB           1 SAB             Past Medical History:  Diagnosis Date   Anxiety    COVID-19     History reviewed. No pertinent surgical history.  Family History  Problem Relation Age of Onset   Heart attack Father    Diabetes Maternal Aunt    Diabetes Paternal Aunt    Stroke Paternal Grandmother     Social History   Socioeconomic History   Marital status: Divorced    Spouse name: Not on file   Number of children:  Not on file   Years of education: Not on file   Highest education level: Not on file  Occupational History   Not on file  Tobacco Use   Smoking status: Every Day    Types: Cigarettes   Smokeless tobacco: Never  Vaping Use   Vaping status: Never Used  Substance and Sexual Activity   Alcohol use: No   Drug use: No   Sexual activity: Not Currently    Birth control/protection: Surgical    Comment: BTL  Other Topics Concern   Not on file  Social History Narrative   Not on file   Social Determinants of Health   Financial Resource Strain: Not on file  Food Insecurity: No Food Insecurity (12/07/2022)   Hunger Vital Sign    Worried About Running Out of Food in  the Last Year: Never true    Ran Out of Food in the Last Year: Never true  Transportation Needs: No Transportation Needs (12/07/2022)   PRAPARE - Administrator, Civil Service (Medical): No    Lack of Transportation (Non-Medical): No  Physical Activity: Not on file  Stress: Not on file  Social Connections: Not on file  Intimate Partner Violence: Not At Risk (12/07/2022)   Humiliation, Afraid, Rape, and Kick questionnaire    Fear of Current or Ex-Partner: No    Emotionally Abused: No    Physically Abused: No    Sexually Abused: No    Current Outpatient Medications on File Prior to Visit  Medication Sig Dispense Refill   clindamycin (CLEOCIN) 150 MG capsule Take 150 mg by mouth 2 (two) times daily as needed.     clonazePAM (KLONOPIN) 0.5 MG tablet Take 0.5 mg by mouth 2 (two) times daily as needed for anxiety.     levothyroxine (SYNTHROID) 88 MCG tablet Take 88 mcg by mouth every morning.     metFORMIN (GLUCOPHAGE) 1000 MG tablet Take 500 mg by mouth 2 (two) times daily.     ondansetron (ZOFRAN) 4 MG tablet Take 4 mg by mouth every 8 (eight) hours as needed.     ferrous gluconate (FERGON) 324 MG tablet Take 1 tablet (324 mg total) by mouth every Monday, Wednesday, and Friday. (Patient not taking: Reported on  12/17/2022) 36 tablet 3   No current facility-administered medications on file prior to visit.    No Known Allergies   Review of Systems Constitutional: negative for chills, fatigue, fevers and sweats Eyes: negative for irritation, redness and visual disturbance Ears, nose, mouth, throat, and face: negative for hearing loss, nasal congestion, snoring and tinnitus Respiratory: negative for asthma, cough, sputum Cardiovascular: negative for chest pain, dyspnea, exertional chest pressure/discomfort, irregular heart beat, palpitations and syncope Gastrointestinal: negative for abdominal pain, change in bowel habits, nausea and vomiting Genitourinary: negative for abnormal menstrual periods, genital lesions, sexual problems and vaginal discharge, dysuria and urinary incontinence Integument/breast: negative for breast lump, breast tenderness and nipple discharge Hematologic/lymphatic: negative for bleeding and easy bruising Musculoskeletal:negative for back pain and muscle weakness Neurological: negative for dizziness, headaches, vertigo and weakness Endocrine: negative for diabetic symptoms including polydipsia, polyuria and skin dryness Allergic/Immunologic: negative for hay fever and urticaria      Objective:  Blood pressure 127/87, pulse 73, height 5\' 6"  (1.676 m), weight 267 lb 9.6 oz (121.4 kg), last menstrual period 11/15/2022. Body mass index is 43.19 kg/m.    General Appearance:    Alert, cooperative, no distress, appears stated age  Head:    Normocephalic, without obvious abnormality, atraumatic  Eyes:    PERRL, conjunctiva/corneas clear, EOM's intact, both eyes  Ears:    Normal external ear canals, both ears  Nose:   Nares normal, septum midline, mucosa normal, no drainage or sinus tenderness  Throat:   Lips, mucosa, and tongue normal; teeth and gums normal  Neck:   Supple, symmetrical, trachea midline, no adenopathy; thyroid: no enlargement/tenderness/nodules; no carotid  bruit or JVD  Back:     Symmetric, no curvature, ROM normal, no CVA tenderness  Lungs:     Clear to auscultation bilaterally, respirations unlabored  Chest Wall:    No tenderness or deformity   Heart:    Regular rate and rhythm, S1 and S2 normal, no murmur, rub or gallop  Breast Exam:    No tenderness, masses, or nipple abnormality  Abdomen:  Soft, non-tender, bowel sounds active all four quadrants, no masses, no organomegaly.    Genitalia:    Pelvic:external genitalia normal, vagina without lesions, discharge, or tenderness, rectovaginal septum  normal. Cervix normal in appearance, no cervical motion tenderness, no adnexal masses or tenderness.  Uterus normal size, shape, mobile, regular contours, nontender.  Rectal:      No hemorrhoids appreciated. Internal exam not done.   Extremities:   Extremities normal, atraumatic, no cyanosis or edema  Pulses:   2+ and symmetric all extremities  Skin:   Skin color, texture, turgor normal, no rashes or lesions  Lymph nodes:   Cervical, supraclavicular, and axillary nodes normal  Neurologic:   CNII-XII intact, normal strength, sensation and reflexes throughout   .  Labs:  Lab Results  Component Value Date   WBC 9.0 12/07/2022   HGB 8.1 (L) 12/07/2022   HCT 27.4 (L) 12/07/2022   MCV 74.5 (L) 12/07/2022   PLT 255 12/07/2022    Lab Results  Component Value Date   CREATININE 0.75 11/30/2022   BUN 16 11/30/2022   NA 135 11/30/2022   K 3.5 11/30/2022   CL 106 11/30/2022   CO2 19 (L) 11/30/2022    Lab Results  Component Value Date   ALT 29 09/30/2018   AST 27 09/30/2018   ALKPHOS 55 09/30/2018   BILITOT 0.7 09/30/2018    Lab Results  Component Value Date   TSH 9.85 (H) 12/15/2011     Assessment:   1. Establishing care with new doctor, encounter for   2. Abnormal vaginal bleeding   3. Abnormal uterine bleeding (AUB)   4. Iron deficiency anemia due to chronic blood loss   5. Screening for cervical cancer   6. Encounter for  screening mammogram for malignant neoplasm of breast   7. Well woman exam with routine gynecological exam      Plan:  Blood tests: FSH. Breast self exam technique reviewed and patient encouraged to perform self-exam monthly. Discussed healthy lifestyle modifications. Mammogram ordered Pap smear ordered.  Follow up in 1 year for annual exam/prn return of AUB   Marice Potter, Leanora Ivanoff, MD [facility]

## 2022-12-18 LAB — FOLLICLE STIMULATING HORMONE: FSH: 7.8 m[IU]/mL

## 2022-12-19 ENCOUNTER — Inpatient Hospital Stay: Payer: Medicaid Other

## 2022-12-19 VITALS — BP 133/71 | HR 75 | Temp 97.3°F

## 2022-12-19 DIAGNOSIS — D649 Anemia, unspecified: Secondary | ICD-10-CM

## 2022-12-19 DIAGNOSIS — D509 Iron deficiency anemia, unspecified: Secondary | ICD-10-CM | POA: Diagnosis not present

## 2022-12-19 MED ORDER — IRON SUCROSE 20 MG/ML IV SOLN
200.0000 mg | Freq: Once | INTRAVENOUS | Status: AC
  Administered 2022-12-19: 200 mg via INTRAVENOUS
  Filled 2022-12-19: qty 10

## 2022-12-19 NOTE — Progress Notes (Signed)
Declined 30 minute post observation. Aware of risks. Vitals stable at discharge.  

## 2022-12-19 NOTE — Patient Instructions (Signed)
Iron Sucrose Injection What is this medication? IRON SUCROSE (EYE ern SOO krose) treats low levels of iron (iron deficiency anemia) in people with kidney disease. Iron is a mineral that plays an important role in making red blood cells, which carry oxygen from your lungs to the rest of your body. This medicine may be used for other purposes; ask your health care provider or pharmacist if you have questions. COMMON BRAND NAME(S): Venofer What should I tell my care team before I take this medication? They need to know if you have any of these conditions: Anemia not caused by low iron levels Heart disease High levels of iron in the blood Kidney disease Liver disease An unusual or allergic reaction to iron, other medications, foods, dyes, or preservatives Pregnant or trying to get pregnant Breastfeeding How should I use this medication? This medication is for infusion into a vein. It is given in a hospital or clinic setting. Talk to your care team about the use of this medication in children. While this medication may be prescribed for children as young as 2 years for selected conditions, precautions do apply. Overdosage: If you think you have taken too much of this medicine contact a poison control center or emergency room at once. NOTE: This medicine is only for you. Do not share this medicine with others. What if I miss a dose? Keep appointments for follow-up doses. It is important not to miss your dose. Call your care team if you are unable to keep an appointment. What may interact with this medication? Do not take this medication with any of the following: Deferoxamine Dimercaprol Other iron products This medication may also interact with the following: Chloramphenicol Deferasirox This list may not describe all possible interactions. Give your health care provider a list of all the medicines, herbs, non-prescription drugs, or dietary supplements you use. Also tell them if you smoke,  drink alcohol, or use illegal drugs. Some items may interact with your medicine. What should I watch for while using this medication? Visit your care team regularly. Tell your care team if your symptoms do not start to get better or if they get worse. You may need blood work done while you are taking this medication. You may need to follow a special diet. Talk to your care team. Foods that contain iron include: whole grains/cereals, dried fruits, beans, or peas, leafy green vegetables, and organ meats (liver, kidney). What side effects may I notice from receiving this medication? Side effects that you should report to your care team as soon as possible: Allergic reactions--skin rash, itching, hives, swelling of the face, lips, tongue, or throat Low blood pressure--dizziness, feeling faint or lightheaded, blurry vision Shortness of breath Side effects that usually do not require medical attention (report to your care team if they continue or are bothersome): Flushing Headache Joint pain Muscle pain Nausea Pain, redness, or irritation at injection site This list may not describe all possible side effects. Call your doctor for medical advice about side effects. You may report side effects to FDA at 1-800-FDA-1088. Where should I keep my medication? This medication is given in a hospital or clinic. It will not be stored at home. NOTE: This sheet is a summary. It may not cover all possible information. If you have questions about this medicine, talk to your doctor, pharmacist, or health care provider.  2024 Elsevier/Gold Standard (2022-07-20 00:00:00)

## 2022-12-20 LAB — CYTOLOGY - PAP
Adequacy: ABSENT
Comment: NEGATIVE
Diagnosis: NEGATIVE
High risk HPV: NEGATIVE

## 2022-12-26 ENCOUNTER — Inpatient Hospital Stay: Payer: Medicaid Other

## 2022-12-26 VITALS — BP 149/77 | HR 76 | Temp 98.0°F | Resp 18

## 2022-12-26 DIAGNOSIS — D509 Iron deficiency anemia, unspecified: Secondary | ICD-10-CM | POA: Diagnosis not present

## 2022-12-26 DIAGNOSIS — D649 Anemia, unspecified: Secondary | ICD-10-CM

## 2022-12-26 MED ORDER — IRON SUCROSE 20 MG/ML IV SOLN
200.0000 mg | Freq: Once | INTRAVENOUS | Status: AC
  Administered 2022-12-26: 200 mg via INTRAVENOUS
  Filled 2022-12-26: qty 10

## 2022-12-26 MED ORDER — SODIUM CHLORIDE 0.9% FLUSH
10.0000 mL | Freq: Once | INTRAVENOUS | Status: AC | PRN
Start: 2022-12-26 — End: 2022-12-26
  Administered 2022-12-26: 10 mL
  Filled 2022-12-26: qty 10

## 2022-12-27 ENCOUNTER — Encounter: Payer: Self-pay | Admitting: Internal Medicine

## 2022-12-27 ENCOUNTER — Encounter: Payer: Self-pay | Admitting: Cardiology

## 2022-12-27 ENCOUNTER — Ambulatory Visit
Admission: RE | Admit: 2022-12-27 | Discharge: 2022-12-27 | Disposition: A | Discharge status: Home / Self Care | Payer: Medicaid Other | Source: Ambulatory Visit | Attending: Cardiology | Admitting: Cardiology

## 2022-12-27 ENCOUNTER — Ambulatory Visit: Payer: Self-pay | Admitting: Cardiology

## 2022-12-27 ENCOUNTER — Ambulatory Visit
Admission: RE | Admit: 2022-12-27 | Discharge: 2022-12-27 | Disposition: A | Discharge status: Home / Self Care | Payer: Medicaid Other | Attending: Cardiology | Admitting: Cardiology

## 2022-12-27 VITALS — BP 152/92 | HR 85 | Ht 66.0 in | Wt 265.4 lb

## 2022-12-27 DIAGNOSIS — L309 Dermatitis, unspecified: Secondary | ICD-10-CM | POA: Diagnosis not present

## 2022-12-27 DIAGNOSIS — Z720 Tobacco use: Secondary | ICD-10-CM | POA: Diagnosis not present

## 2022-12-27 DIAGNOSIS — M542 Cervicalgia: Secondary | ICD-10-CM | POA: Diagnosis not present

## 2022-12-27 DIAGNOSIS — E039 Hypothyroidism, unspecified: Secondary | ICD-10-CM | POA: Diagnosis not present

## 2022-12-27 DIAGNOSIS — R0683 Snoring: Secondary | ICD-10-CM | POA: Diagnosis not present

## 2022-12-27 DIAGNOSIS — E119 Type 2 diabetes mellitus without complications: Secondary | ICD-10-CM

## 2022-12-27 DIAGNOSIS — G8929 Other chronic pain: Secondary | ICD-10-CM | POA: Insufficient documentation

## 2022-12-27 DIAGNOSIS — I10 Essential (primary) hypertension: Secondary | ICD-10-CM

## 2022-12-27 MED ORDER — HYDROCORTISONE 1 % EX CREA
TOPICAL_CREAM | CUTANEOUS | 1 refills | Status: AC
Start: 2022-12-27 — End: 2023-12-27

## 2022-12-27 NOTE — Progress Notes (Signed)
New Patient Office Visit  Subjective    Patient ID: Brittany Keller, female    DOB: Feb 27, 1981  Age: 42 y.o. MRN: [id number]  CC:  Chief Complaint  Patient presents with   Establish Care    NPE- Establish care    HPI Brittany Keller presents to establish care Previous Primary Care provider/office:   she does have additional concerns to discuss today.   Patient in office to establish care. Patient currently smoking, attempting to quit. Daughter will need a lung transplant in the next three years for CF. Patient slowly cutting back to eventually quit.  Patient complaining of left neck pain, numbness down left arm. Will order an xray of the c-spine.  Patient complaining of rashes, started on her shins bilaterally. Both have cleared up. Now she has a rash on her right elbow. Will send in hydrocortisone cream and refer to dermatology. Patient reports she snores, unsure if she as apneic episodes. Will send referral to sleep studies.  Mammogram order by GYN. Patient will call to schedule.  Patient sees oncology for blood disorder, continue.  Patient will return for fasting lab work.  Discussed GAD7 and PHQ9, patient takes clonazepam as need, does not feel she needs additional antianxiety or antidepressant.     Outpatient Encounter Medications as of 12/27/2022  Medication Sig   clindamycin (CLEOCIN) 150 MG capsule Take 150 mg by mouth 2 (two) times daily as needed.   clonazePAM (KLONOPIN) 0.5 MG tablet Take 0.5 mg by mouth 2 (two) times daily as needed for anxiety.   hydrocortisone cream 1 % Apply to affected area 2 times daily   levothyroxine (SYNTHROID) 88 MCG tablet Take 88 mcg by mouth every morning.   metFORMIN (GLUCOPHAGE) 1000 MG tablet Take 500 mg by mouth 2 (two) times daily.   ondansetron (ZOFRAN) 4 MG tablet Take 4 mg by mouth every 8 (eight) hours as needed.   [DISCONTINUED] ferrous gluconate (FERGON) 324 MG tablet Take 1 tablet (324 mg total) by mouth every  Monday, Wednesday, and Friday. (Patient not taking: Reported on 12/17/2022)   No facility-administered encounter medications on file as of 12/27/2022.    Past Medical History:  Diagnosis Date   Anxiety    COVID-19     No past surgical history on file.  Family History  Problem Relation Age of Onset   Heart attack Father    Diabetes Maternal Aunt    Diabetes Paternal Aunt    Stroke Paternal Grandmother     Social History   Socioeconomic History   Marital status: Divorced    Spouse name: Not on file   Number of children: Not on file   Years of education: Not on file   Highest education level: Not on file  Occupational History   Not on file  Tobacco Use   Smoking status: Every Day    Types: Cigarettes   Smokeless tobacco: Never  Vaping Use   Vaping status: Never Used  Substance and Sexual Activity   Alcohol use: No   Drug use: No   Sexual activity: Not Currently    Birth control/protection: Surgical    Comment: BTL  Other Topics Concern   Not on file  Social History Narrative   Not on file   Social Determinants of Health   Financial Resource Strain: Not on file  Food Insecurity: No Food Insecurity (12/07/2022)   Hunger Vital Sign    Worried About Running Out of Food in the Last Year: Never true  Ran Out of Food in the Last Year: Never true  Transportation Needs: No Transportation Needs (12/07/2022)   PRAPARE - Administrator, Civil Service (Medical): No    Lack of Transportation (Non-Medical): No  Physical Activity: Not on file  Stress: Not on file  Social Connections: Not on file  Intimate Partner Violence: Not At Risk (12/07/2022)   Humiliation, Afraid, Rape, and Kick questionnaire    Fear of Current or Ex-Partner: No    Emotionally Abused: No    Physically Abused: No    Sexually Abused: No    Review of Systems  Constitutional: Negative.   HENT: Negative.    Eyes: Negative.   Respiratory: Negative.  Negative for shortness of breath.    Cardiovascular: Negative.  Negative for chest pain.  Gastrointestinal: Negative.  Negative for abdominal pain, constipation and diarrhea.  Genitourinary: Negative.   Musculoskeletal:  Positive for neck pain. Negative for joint pain and myalgias.  Skin:  Positive for rash.  Neurological: Negative.  Negative for dizziness and headaches.  Endo/Heme/Allergies: Negative.   All other systems reviewed and are negative.     Objective    BP (!) 152/92   Pulse 85   Ht 5\' 6"  (1.676 m)   Wt 265 lb 6.4 oz (120.4 kg)   LMP 11/15/2022 (Exact Date) Comment: pt declines any chances of pregnancy  SpO2 96%   BMI 42.84 kg/m   Physical Exam Vitals and nursing note reviewed.  Constitutional:      Appearance: Normal appearance. She is normal weight.  HENT:     Head: Normocephalic and atraumatic.     Nose: Nose normal.     Mouth/Throat:     Mouth: Mucous membranes are moist.  Eyes:     Extraocular Movements: Extraocular movements intact.     Conjunctiva/sclera: Conjunctivae normal.     Pupils: Pupils are equal, round, and reactive to light.  Cardiovascular:     Rate and Rhythm: Normal rate and regular rhythm.     Pulses: Normal pulses.     Heart sounds: Normal heart sounds.  Pulmonary:     Effort: Pulmonary effort is normal.     Breath sounds: Normal breath sounds.  Abdominal:     General: Abdomen is flat. Bowel sounds are normal.     Palpations: Abdomen is soft.  Musculoskeletal:        General: Normal range of motion.     Cervical back: Normal range of motion.  Skin:    General: Skin is warm and dry.  Neurological:     General: No focal deficit present.     Mental Status: She is alert and oriented to person, place, and time.  Psychiatric:        Mood and Affect: Mood normal.        Behavior: Behavior normal.        Thought Content: Thought content normal.        Judgment: Judgment normal.            12/27/2022   10:58 AM  GAD 7 : Generalized Anxiety Score  Nervous,  Anxious, on Edge 1  Control/stop worrying 1  Worry too much - different things 1  Trouble relaxing 1  Restless 0  Easily annoyed or irritable 2  Afraid - awful might happen 0  Total GAD 7 Score 6    Flowsheet Row Office Visit from 12/27/2022 in [facility]  Thoughts that you would be better off dead, or of  hurting yourself in some way Not at all  PHQ-9 Total Score 5          Assessment & Plan:  Continue to work on smoking cessation.  Xray of the c-spine.  Referral sent to dermatology.  Referral sent to sleep center.  Schedule mammogram. Return for fasting labs.   Problem List Items Addressed This Visit       Endocrine   Hypothyroidism - Primary   Type 2 diabetes mellitus without complications (HCC)     Other   Smoking trying to quit    No follow-ups on file.   Total time spent: 25 minutes  [employer], NP  12/27/2022   This document may have been prepared by Dragon Voice Recognition software and as such may include unintentional dictation errors.

## 2022-12-28 ENCOUNTER — Other Ambulatory Visit: Payer: Medicaid Other

## 2022-12-28 DIAGNOSIS — E785 Hyperlipidemia, unspecified: Secondary | ICD-10-CM | POA: Diagnosis not present

## 2022-12-28 DIAGNOSIS — E039 Hypothyroidism, unspecified: Secondary | ICD-10-CM | POA: Diagnosis not present

## 2022-12-28 DIAGNOSIS — E119 Type 2 diabetes mellitus without complications: Secondary | ICD-10-CM | POA: Diagnosis not present

## 2022-12-28 DIAGNOSIS — I10 Essential (primary) hypertension: Secondary | ICD-10-CM

## 2022-12-28 NOTE — Progress Notes (Signed)
Sent message via mychart

## 2022-12-29 ENCOUNTER — Other Ambulatory Visit: Payer: Self-pay | Admitting: Obstetrics & Gynecology

## 2022-12-29 DIAGNOSIS — Z1231 Encounter for screening mammogram for malignant neoplasm of breast: Secondary | ICD-10-CM

## 2022-12-29 LAB — CMP14+EGFR
ALT: 28 [IU]/L (ref 0–32)
AST: 31 [IU]/L (ref 0–40)
Albumin: 4.1 g/dL (ref 3.9–4.9)
Alkaline Phosphatase: 66 [IU]/L (ref 44–121)
BUN/Creatinine Ratio: 16 (ref 9–23)
BUN: 11 mg/dL (ref 6–24)
Bilirubin Total: <0.2 mg/dL (ref 0.0–1.2)
CO2: 20 mmol/L (ref 20–29)
Calcium: 9.9 mg/dL (ref 8.7–10.2)
Chloride: 104 mmol/L (ref 96–106)
Creatinine, Ser: 0.68 mg/dL (ref 0.57–1.00)
Globulin, Total: 2.8 g/dL (ref 1.5–4.5)
Glucose: 123 mg/dL — ABNORMAL HIGH (ref 70–99)
Potassium: 4.8 mmol/L (ref 3.5–5.2)
Sodium: 139 mmol/L (ref 134–144)
Total Protein: 6.9 g/dL (ref 6.0–8.5)
eGFR: 111 mL/min/{1.73_m2} (ref >59)

## 2022-12-29 LAB — HEMOGLOBIN A1C
Est. average glucose Bld gHb Est-mCnc: 117 mg/dL
Hgb A1c MFr Bld: 5.7 % — ABNORMAL HIGH (ref 4.8–5.6)

## 2022-12-29 LAB — LIPID PANEL
Chol/HDL Ratio: 4.2 ratio (ref 0.0–4.4)
Cholesterol, Total: 191 mg/dL (ref 100–199)
HDL: 45 mg/dL (ref >39)
LDL Chol Calc (NIH): 111 mg/dL — ABNORMAL HIGH (ref 0–99)
Triglycerides: 201 mg/dL — ABNORMAL HIGH (ref 0–149)
VLDL Cholesterol Cal: 35 mg/dL (ref 5–40)

## 2022-12-29 LAB — TSH: TSH: 3.47 u[IU]/mL (ref 0.450–4.500)

## 2023-01-01 ENCOUNTER — Ambulatory Visit
Admission: RE | Admit: 2023-01-01 | Discharge: 2023-01-01 | Disposition: A | Discharge status: Home / Self Care | Payer: Medicaid Other | Source: Ambulatory Visit

## 2023-01-01 DIAGNOSIS — Z1231 Encounter for screening mammogram for malignant neoplasm of breast: Secondary | ICD-10-CM

## 2023-01-02 ENCOUNTER — Inpatient Hospital Stay: Payer: Medicaid Other | Attending: Internal Medicine

## 2023-01-02 VITALS — BP 140/74 | HR 80 | Temp 98.5°F | Resp 16

## 2023-01-02 DIAGNOSIS — N939 Abnormal uterine and vaginal bleeding, unspecified: Secondary | ICD-10-CM | POA: Diagnosis present

## 2023-01-02 DIAGNOSIS — D649 Anemia, unspecified: Secondary | ICD-10-CM

## 2023-01-02 DIAGNOSIS — D509 Iron deficiency anemia, unspecified: Secondary | ICD-10-CM | POA: Insufficient documentation

## 2023-01-02 MED ORDER — IRON SUCROSE 20 MG/ML IV SOLN
200.0000 mg | Freq: Once | INTRAVENOUS | Status: AC
  Administered 2023-01-02: 200 mg via INTRAVENOUS
  Filled 2023-01-02: qty 10

## 2023-01-02 MED ORDER — SODIUM CHLORIDE 0.9 % IV SOLN
Freq: Once | INTRAVENOUS | Status: DC
  Filled 2023-01-02: qty 250

## 2023-01-14 ENCOUNTER — Other Ambulatory Visit: Payer: Self-pay

## 2023-01-14 ENCOUNTER — Emergency Department: Payer: Medicaid Other

## 2023-01-14 ENCOUNTER — Emergency Department
Admission: EM | Admit: 2023-01-14 | Discharge: 2023-01-14 | Disposition: A | Discharge status: Home / Self Care | Payer: Medicaid Other | Attending: Emergency Medicine | Admitting: Emergency Medicine

## 2023-01-14 DIAGNOSIS — E119 Type 2 diabetes mellitus without complications: Secondary | ICD-10-CM | POA: Diagnosis not present

## 2023-01-14 DIAGNOSIS — R079 Chest pain, unspecified: Secondary | ICD-10-CM

## 2023-01-14 DIAGNOSIS — M542 Cervicalgia: Secondary | ICD-10-CM | POA: Insufficient documentation

## 2023-01-14 DIAGNOSIS — R0789 Other chest pain: Secondary | ICD-10-CM | POA: Diagnosis not present

## 2023-01-14 LAB — CBC
HCT: 38 % (ref 36.0–46.0)
Hemoglobin: 12 g/dL (ref 12.0–15.0)
MCH: 27.3 pg (ref 26.0–34.0)
MCHC: 31.6 g/dL (ref 30.0–36.0)
MCV: 86.4 fL (ref 80.0–100.0)
Platelets: 337 10*3/uL (ref 150–400)
RBC: 4.4 MIL/uL (ref 3.87–5.11)
RDW: 28.7 % — ABNORMAL HIGH (ref 11.5–15.5)
WBC: 8.6 10*3/uL (ref 4.0–10.5)
nRBC: 0 % (ref 0.0–0.2)

## 2023-01-14 LAB — BASIC METABOLIC PANEL
Anion gap: 9 (ref 5–15)
BUN: 16 mg/dL (ref 6–20)
CO2: 20 mmol/L — ABNORMAL LOW (ref 22–32)
Calcium: 8.8 mg/dL — ABNORMAL LOW (ref 8.9–10.3)
Chloride: 107 mmol/L (ref 98–111)
Creatinine, Ser: 0.73 mg/dL (ref 0.44–1.00)
GFR, Estimated: >60 mL/min (ref >60)
Glucose, Bld: 151 mg/dL — ABNORMAL HIGH (ref 70–99)
Potassium: 4.1 mmol/L (ref 3.5–5.1)
Sodium: 136 mmol/L (ref 135–145)

## 2023-01-14 LAB — TROPONIN I (HIGH SENSITIVITY): Troponin I (High Sensitivity): 5 ng/L (ref <18)

## 2023-01-14 MED ORDER — NAPROXEN 500 MG PO TABS: 500.0000 mg | ORAL_TABLET | Freq: Two times a day (BID) | ORAL | 0 refills | Status: AC

## 2023-01-14 MED ORDER — DIAZEPAM 5 MG PO TABS
10.0000 mg | ORAL_TABLET | Freq: Once | ORAL | Status: AC
  Administered 2023-01-14: 10 mg via ORAL
  Filled 2023-01-14: qty 2

## 2023-01-14 MED ORDER — LIDOCAINE 5 % EX PTCH
1.0000 | MEDICATED_PATCH | CUTANEOUS | Status: DC
  Administered 2023-01-14: 1 via TRANSDERMAL
  Filled 2023-01-14: qty 1

## 2023-01-14 MED ORDER — GABAPENTIN 300 MG PO CAPS: 300.0000 mg | ORAL_CAPSULE | Freq: Every evening | ORAL | 0 refills | Status: DC | PRN

## 2023-01-14 MED ORDER — KETOROLAC TROMETHAMINE 15 MG/ML IJ SOLN
15.0000 mg | Freq: Once | INTRAMUSCULAR | Status: AC
  Administered 2023-01-14: 15 mg via INTRAMUSCULAR
  Filled 2023-01-14: qty 1

## 2023-01-14 NOTE — ED Triage Notes (Signed)
Pt reports ongoing pain in left side neck and into left arm for the last several weeks. Pt reports left sided chest pain that has been ongoing for several weeks as well. Pt reports taking muscle relaxer without relief.

## 2023-01-14 NOTE — Discharge Instructions (Addendum)
You were seen in the emergency department today for evaluation of your neck pain and chest pain.  Your testing was fortunately reassuring against an emergency cause for your symptoms.  You can continue to use your muscle relaxer as needed.  If sent a prescription for nerve medicine called gabapentin to your pharmacy that you can take as needed.  This can make you drowsy, do not drive or operate machinery when taking this.  I have also sent an anti-inflammatory to your pharmacy.  You can also use Tylenol and over-the-counter lidocaine patches as needed.  I have included information for follow-up with a spine specialist for further evaluation of your symptoms.  Return to the ER for new or worsening symptoms.

## 2023-01-14 NOTE — ED Provider Notes (Signed)
[facility] Provider Note    Event Date/Time   First MD Initiated Contact with Patient 01/14/23 [phone]     (approximate)   History   Neck Pain and Chest Pain   HPI  Brittany Keller is a 42 year old female with history of T2DM presenting to the Emergency Department for evaluation of neck pain.  For the past 4 weeks, patient has had ongoing pain in her left neck, occasionally radiating to her left arm and chest.  She saw her primary care doctor who prescribed her a muscle relaxer and recommended follow-up with a chiropractor.  She reports the Flexeril makes her sleepy but has not provided significant pain relief.  Also has been taking ibuprofen intermittently.  No focal numbness, tingling, weakness.  No fevers or chills.  No recent trauma.  Presents today given ongoing symptoms.    Physical Exam   Triage Vital Signs: ED Triage Vitals [01/14/23 0414]  Encounter Vitals Group     BP 95/75     Systolic BP Percentile      Diastolic BP Percentile      Pulse Rate 85     Resp 18     Temp (!) 97.5 F (36.4 C)     Temp Source Oral     SpO2 99 %     Weight 238 lb (108 kg)     Height      Head Circumference      Peak Flow      Pain Score      Pain Loc      Pain Education      Exclude from Growth Chart     Most recent vital signs: Vitals:   01/14/23 0414 01/14/23 0631  BP: 95/75 134/78  Pulse: 85 75  Resp: 18 18  Temp: (!) 97.5 F (36.4 C) 97.7 F (36.5 C)  SpO2: 99%      General: Awake, interactive  HEENT: Tenderness over the left upper back extending to the midline with reproducible tenderness over the paraspinous musculature into the shoulder.  Full range of motion of the neck.  Negative Spurling sign bilaterally.  No overlying skin changes. CV:  Regular rate, good peripheral perfusion.  Resp:  Unlabored respirations.  Abd:  Nondistended.  Neuro:  Symmetric facial movement, fluid speech, 5-5 strength in the bilateral upper and lower  extremities, intact sensation, normal finger-to-nose testing   ED Results / Procedures / Treatments   Labs (all labs ordered are listed, but only abnormal results are displayed) Labs Reviewed  BASIC METABOLIC PANEL - Abnormal; Notable for the following components:      Result Value   CO2 20 (*)    Glucose, Bld 151 (*)    Calcium 8.8 (*)    All other components within normal limits  CBC - Abnormal; Notable for the following components:   RDW 28.7 (*)    All other components within normal limits  POC URINE PREG, ED  TROPONIN I (HIGH SENSITIVITY)     EKG EKG independently reviewed interpreted by myself (ER attending) demonstrates:  EKG demonstrates normal sinus rhythm at a rate of 73, PR 152, QRS 90, QTc 458, no acute ST changes  RADIOLOGY Imaging independently reviewed and interpreted by myself demonstrates:  CXR without focal consolidation  PROCEDURES:  Critical Care performed: No  Procedures   MEDICATIONS ORDERED IN ED: Medications  lidocaine (LIDODERM) 5 % 1 patch (1 patch Transdermal Patch Applied 01/14/23 0835)  diazepam (VALIUM) tablet 10 mg (10  mg Oral Given 01/14/23 0833)  ketorolac (TORADOL) 15 MG/ML injection 15 mg (15 mg Intramuscular Given 01/14/23 0834)     IMPRESSION / MDM / ASSESSMENT AND PLAN / ED COURSE  I reviewed the triage vital signs and the nursing notes.  Differential diagnosis includes, but is not limited to, musculoskeletal strain, cervical radiculopathy, pneumonia, pneumothorax, low suspicion ACS, low risk PE and PERC negative  Patient's presentation is most consistent with acute illness / injury with system symptoms.  42 year old female presenting to the emergency department for evaluation of neck pain ongoing for several weeks.  Vital stable on presentation.  Labs reassuring.  Chest x-Joyceann Kruser without focal findings.  Clinical presentation seems most consistent with musculoskeletal pain, consideration for hospital cervical radiculopathy, but  reassuring neurologic exam here.  Did trial Toradol, Valium, lidocaine patch here.  Patient with some ongoing pain.  Has Flexeril at home, will provide prescription for gabapentin as well as an anti-inflammatory.  Considered steroids, but with shared decision making decided to defer given patient's history of diabetes.  She was given information for follow-up with neurosurgery.  Strict return precautions were provided.  Patient was discharged stable condition.      FINAL CLINICAL IMPRESSION(S) / ED DIAGNOSES   Final diagnoses:  Neck pain     Rx / DC Orders   ED Discharge Orders          Ordered    gabapentin (NEURONTIN) 300 MG capsule  At bedtime PRN        01/14/23 0919    naproxen (NAPROSYN) 500 MG tablet  2 times daily with meals        01/14/23 0919             Note:  This document was prepared using Dragon voice recognition software and may include unintentional dictation errors.   Trinna Post, MD 01/14/23 [phone]

## 2023-01-23 NOTE — Progress Notes (Signed)
Referring Physician:  Trinna Post, MD [address] [place],  [place] [address]  Primary Physician:  [facility]  History of Present Illness: 01/28/2023 Ms. Brittany Keller is here today with a chief complaint of left sided neck pain radiating down her left arm into all five fingers x 8 weeks. She denies any precipitating event. This is associated with numbness and tingling. The pain is described as constant and only relieved some by raising her arm in the air. Prednisone also helped her pain as well as gabapentin and meloxicam. She denies any changes to her gait or saddle anesthesia. She has been doing stretches and exercises at home without relief.  The symptoms are causing a significant impact on the patient's life.   Review of Systems:  A 10 point review of systems is negative, except for the pertinent positives and negatives detailed in the HPI.  Past Medical History: Past Medical History:  Diagnosis Date   Anxiety    COVID-19     Past Surgical History: History reviewed. No pertinent surgical history.  Allergies: Allergies as of 01/28/2023   (No Known Allergies)    Medications: Outpatient Encounter Medications as of 01/28/2023  Medication Sig   clindamycin (CLEOCIN) 150 MG capsule Take 150 mg by mouth 2 (two) times daily as needed.   diazepam (VALIUM) 2 MG tablet Take 2 mg by mouth 2 (two) times daily.   gabapentin (NEURONTIN) 300 MG capsule Take 1 capsule (300 mg total) by mouth at bedtime as needed for up to 14 days (nerve pain).   hydrocortisone cream 1 % Apply to affected area 2 times daily   levothyroxine (SYNTHROID) 88 MCG tablet Take 88 mcg by mouth every morning.   meloxicam (MOBIC) 15 MG tablet Take 15 mg by mouth 2 (two) times daily.   metFORMIN (GLUCOPHAGE) 1000 MG tablet Take 500 mg by mouth 2 (two) times daily.   ondansetron (ZOFRAN) 4 MG tablet Take 4 mg by mouth every 8 (eight) hours as needed.   clonazePAM (KLONOPIN) 0.5 MG tablet Take 0.5 mg by  mouth 2 (two) times daily as needed for anxiety. (Patient not taking: Reported on 01/28/2023)   No facility-administered encounter medications on file as of 01/28/2023.    Social History: Social History   Tobacco Use   Smoking status: Every Day    Types: Cigarettes   Smokeless tobacco: Never  Vaping Use   Vaping status: Never Used  Substance Use Topics   Alcohol use: No   Drug use: No    Family Medical History: Family History  Problem Relation Age of Onset   Heart attack Father    Diabetes Maternal Aunt    Diabetes Paternal Aunt    Stroke Paternal Grandmother    BRCA 1/2 Neg Hx    Breast cancer Neg Hx     Physical Examination:   General: Patient is well developed, well nourished, calm, collected, and in no apparent distress. Attention to examination is appropriate.  Psychiatric: Patient is non-anxious.  Head:  Pupils equal, round, and reactive to light.  ENT:  Oral mucosa appears well hydrated.  Neck:   Supple.  Full range of motion.  Respiratory: Patient is breathing without any difficulty.  Extremities: No edema.  Vascular: Palpable dorsal pedal pulses.  Skin:   On exposed skin, there are no abnormal skin lesions.  NEUROLOGICAL:     Awake, alert, oriented to person, place, and time.  Speech is clear and fluent. Fund of knowledge is appropriate.  Cranial Nerves: Pupils equal round and reactive to light.  Facial tone is symmetric.  Facial sensation is symmetric.   Patient with tenderness to her cervical paraspinals  Strength: Side Biceps Triceps Deltoid Interossei Grip Wrist Ext. Wrist Flex.  R 5 5 5 5 5 5 5   L 5 5 5 5 5  4+ 5    Reflexes are 2+ and symmetric at the biceps and brachioradialis. Hoffman's is absent.  Some decreased sensation in LUE Gait is normal.   No difficulty with tandem gait.   No evidence of dysmetria noted.  Medical Decision Making  Imaging: FINDINGS: There is no evidence of cervical spine fracture or prevertebral  soft tissue swelling. Alignment is normal. No other significant bone abnormalities are identified.   IMPRESSION: Negative cervical spine radiographs.  I have personally reviewed the images and agree with the above interpretation.  Assessment and Plan: Ms. Resop is a pleasant 42 y.o. female is here today with a chief complaint of left sided neck pain radiating down her left arm into all five fingers x 8 weeks. She denies any precipitating event. This is associated with numbness and tingling. The pain is described as constant and only relieved some by raising her arm in the air. Prednisone also helped her pain as well as gabapentin and meloxicam. She denies any changes to her gait or saddle anesthesia.  Concern for acute cervical radiculopathy.  PLAN: -Formal PT referral made -Cervical MRI   Will review MRI once complete. Patient is amenable to injections in the future.   Discussed injection Thank you for involving me in the care of this patient.     Joan Flores, PA-C Dept. of Neurosurgery

## 2023-01-27 ENCOUNTER — Telehealth (INDEPENDENT_AMBULATORY_CARE_PROVIDER_SITE_OTHER): Payer: Self-pay | Admitting: Pulmonary Disease

## 2023-01-27 ENCOUNTER — Encounter: Payer: Self-pay | Admitting: Internal Medicine

## 2023-01-27 DIAGNOSIS — G4733 Obstructive sleep apnea (adult) (pediatric): Secondary | ICD-10-CM | POA: Diagnosis not present

## 2023-01-27 NOTE — Telephone Encounter (Signed)
HST showed severe OSA with AHI 98/ hr Suggest CPAP titration study & sleep consultation

## 2023-01-28 ENCOUNTER — Encounter: Payer: Self-pay | Admitting: Cardiology

## 2023-01-28 ENCOUNTER — Ambulatory Visit: Payer: Medicaid Other | Admitting: Physician Assistant

## 2023-01-28 ENCOUNTER — Ambulatory Visit: Payer: Medicaid Other | Admitting: Cardiology

## 2023-01-28 ENCOUNTER — Encounter: Payer: Self-pay | Admitting: Physician Assistant

## 2023-01-28 VITALS — BP 132/75 | HR 82 | Ht 66.0 in | Wt 261.8 lb

## 2023-01-28 VITALS — BP 134/78 | Ht 66.0 in | Wt 268.0 lb

## 2023-01-28 DIAGNOSIS — M542 Cervicalgia: Secondary | ICD-10-CM

## 2023-01-28 DIAGNOSIS — E039 Hypothyroidism, unspecified: Secondary | ICD-10-CM

## 2023-01-28 DIAGNOSIS — E119 Type 2 diabetes mellitus without complications: Secondary | ICD-10-CM

## 2023-01-28 DIAGNOSIS — Z720 Tobacco use: Secondary | ICD-10-CM | POA: Diagnosis not present

## 2023-01-28 DIAGNOSIS — R29898 Other symptoms and signs involving the musculoskeletal system: Secondary | ICD-10-CM | POA: Diagnosis not present

## 2023-01-28 DIAGNOSIS — M5412 Radiculopathy, cervical region: Secondary | ICD-10-CM | POA: Diagnosis not present

## 2023-01-28 DIAGNOSIS — M5010 Cervical disc disorder with radiculopathy, unspecified cervical region: Secondary | ICD-10-CM

## 2023-01-28 NOTE — Telephone Encounter (Signed)
Results routed to ordering provider.  Will close encounter.

## 2023-01-28 NOTE — Progress Notes (Signed)
Established Patient Office Visit  Subjective:  Patient ID: Brittany Keller, female    DOB: 1981/01/07  Age: 42 y.o. MRN: [id number]  Chief Complaint  Patient presents with   Follow-up    Patient in office for 4 week follow up. Patient reports neck pain improving since being in the ED on 01/14/23. Seeing neurosurgery today.  Patient has an appointment scheduled with dermatology in [facility].  Discussed recent lab work. Hgb A1c improved. LDL still elevated. Will work on diet and exercise. Recheck in 3 months, will start statin at that time if LDL still elevated.  Continues to smoke 1/2 pack a day, working on quitting.  Mammogram was normal.     No other concerns at this time.   Past Medical History:  Diagnosis Date   Anxiety    COVID-19     History reviewed. No pertinent surgical history.  Social History   Socioeconomic History   Marital status: Divorced    Spouse name: Not on file   Number of children: Not on file   Years of education: Not on file   Highest education level: Not on file  Occupational History   Not on file  Tobacco Use   Smoking status: Every Day    Types: Cigarettes   Smokeless tobacco: Never  Vaping Use   Vaping status: Never Used  Substance and Sexual Activity   Alcohol use: No   Drug use: No   Sexual activity: Not Currently    Birth control/protection: Surgical    Comment: BTL  Other Topics Concern   Not on file  Social History Narrative   Not on file   Social Determinants of Health   Financial Resource Strain: Not on file  Food Insecurity: No Food Insecurity (12/07/2022)   Hunger Vital Sign    Worried About Running Out of Food in the Last Year: Never true    Ran Out of Food in the Last Year: Never true  Transportation Needs: No Transportation Needs (12/07/2022)   PRAPARE - Administrator, Civil Service (Medical): No    Lack of Transportation (Non-Medical): No  Physical Activity: Not on file  Stress: Not on file   Social Connections: Not on file  Intimate Partner Violence: Not At Risk (12/07/2022)   Humiliation, Afraid, Rape, and Kick questionnaire    Fear of Current or Ex-Partner: No    Emotionally Abused: No    Physically Abused: No    Sexually Abused: No    Family History  Problem Relation Age of Onset   Heart attack Father    Diabetes Maternal Aunt    Diabetes Paternal Aunt    Stroke Paternal Grandmother    BRCA 1/2 Neg Hx    Breast cancer Neg Hx     No Known Allergies  Outpatient Medications Prior to Visit  Medication Sig   diazepam (VALIUM) 2 MG tablet Take 2 mg by mouth 2 (two) times daily.   meloxicam (MOBIC) 15 MG tablet Take 15 mg by mouth 2 (two) times daily.   clindamycin (CLEOCIN) 150 MG capsule Take 150 mg by mouth 2 (two) times daily as needed.   clonazePAM (KLONOPIN) 0.5 MG tablet Take 0.5 mg by mouth 2 (two) times daily as needed for anxiety.   gabapentin (NEURONTIN) 300 MG capsule Take 1 capsule (300 mg total) by mouth at bedtime as needed for up to 14 days (nerve pain).   hydrocortisone cream 1 % Apply to affected area 2 times daily  levothyroxine (SYNTHROID) 88 MCG tablet Take 88 mcg by mouth every morning.   metFORMIN (GLUCOPHAGE) 1000 MG tablet Take 500 mg by mouth 2 (two) times daily.   ondansetron (ZOFRAN) 4 MG tablet Take 4 mg by mouth every 8 (eight) hours as needed.   No facility-administered medications prior to visit.    Review of Systems  Constitutional: Negative.   HENT: Negative.    Eyes: Negative.   Respiratory: Negative.  Negative for shortness of breath.   Cardiovascular: Negative.  Negative for chest pain.  Gastrointestinal: Negative.  Negative for abdominal pain, constipation and diarrhea.  Genitourinary: Negative.   Musculoskeletal:  Negative for joint pain and myalgias.  Skin: Negative.   Neurological: Negative.  Negative for dizziness and headaches.  Endo/Heme/Allergies: Negative.   All other systems reviewed and are negative.       Objective:   BP 132/75   Pulse 82   Ht 5\' 6"  (1.676 m)   Wt 261 lb 12.8 oz (118.8 kg)   SpO2 98%   BMI 42.26 kg/m   Vitals:   01/28/23 0857  BP: 132/75  Pulse: 82  Height: 5\' 6"  (1.676 m)  Weight: 261 lb 12.8 oz (118.8 kg)  SpO2: 98%  BMI (Calculated): 42.28    Physical Exam Vitals and nursing note reviewed.  Constitutional:      Appearance: Normal appearance. She is normal weight.  HENT:     Head: Normocephalic and atraumatic.     Nose: Nose normal.     Mouth/Throat:     Mouth: Mucous membranes are moist.  Eyes:     Extraocular Movements: Extraocular movements intact.     Conjunctiva/sclera: Conjunctivae normal.     Pupils: Pupils are equal, round, and reactive to light.  Cardiovascular:     Rate and Rhythm: Normal rate and regular rhythm.     Pulses: Normal pulses.     Heart sounds: Normal heart sounds.  Pulmonary:     Effort: Pulmonary effort is normal.     Breath sounds: Normal breath sounds.  Abdominal:     General: Abdomen is flat. Bowel sounds are normal.     Palpations: Abdomen is soft.  Musculoskeletal:        General: Normal range of motion.     Cervical back: Normal range of motion.  Skin:    General: Skin is warm and dry.  Neurological:     General: No focal deficit present.     Mental Status: She is alert and oriented to person, place, and time.  Psychiatric:        Mood and Affect: Mood normal.        Behavior: Behavior normal.        Thought Content: Thought content normal.        Judgment: Judgment normal.      No results found for any visits on 01/28/23.  Recent Results (from the past 2160 hour(s))  Basic metabolic panel     Status: Abnormal   Collection Time: 11/29/22  2:39 PM  Result Value Ref Range   Sodium 132 (L) 135 - 145 mmol/L   Potassium 3.8 3.5 - 5.1 mmol/L   Chloride 104 98 - 111 mmol/L   CO2 17 (L) 22 - 32 mmol/L   Glucose, Bld 138 (H) 70 - 99 mg/dL    Comment: Glucose reference range applies only to samples taken  after fasting for at least 8 hours.   BUN 18 6 - 20 mg/dL   Creatinine, Ser  1.12 (H) 0.44 - 1.00 mg/dL   Calcium 8.7 (L) 8.9 - 10.3 mg/dL   GFR, Estimated >16 >10 mL/min    Comment: (NOTE) Calculated using the CKD-EPI Creatinine Equation (2021)    Anion gap 11 5 - 15    Comment: Performed at [facility], [address] [place], [place] [address]  CBC     Status: Abnormal   Collection Time: 11/29/22  2:39 PM  Result Value Ref Range   WBC 15.0 (H) 4.0 - 10.5 K/uL   RBC 3.42 (L) 3.87 - 5.11 MIL/uL   Hemoglobin 6.6 (L) 12.0 - 15.0 g/dL    Comment: Reticulocyte Hemoglobin testing may be clinically indicated, consider ordering this additional test [id number]    HCT 22.8 (L) 36.0 - 46.0 %   MCV 66.7 (L) 80.0 - 100.0 fL   MCH 19.3 (L) 26.0 - 34.0 pg   MCHC 28.9 (L) 30.0 - 36.0 g/dL   RDW 47.8 (H) 29.5 - 62.1 %   Platelets 463 (H) 150 - 400 K/uL   nRBC 0.5 (H) 0.0 - 0.2 %    Comment: Performed at [facility], [address], [place], [place] [address]  Troponin I (High Sensitivity)     Status: None   Collection Time: 11/29/22  2:39 PM  Result Value Ref Range   Troponin I (High Sensitivity) 11 <18 ng/L    Comment: (NOTE) Elevated high sensitivity troponin I (hsTnI) values and significant  changes across serial measurements may suggest ACS but many other  chronic and acute conditions are known to elevate hsTnI results.  Refer to the "Links" section for chest pain algorithms and additional  guidance. Performed at [facility], [address] [place], [place] [address]   ABO/Rh     Status: None   Collection Time: 11/29/22  2:39 PM  Result Value Ref Range   ABO/RH(D)      B POS Performed at [facility], [address] [place], [place] [address]   hCG, quantitative, pregnancy     Status: None   Collection Time: 11/29/22  2:39 PM  Result Value Ref Range   hCG, Beta Chain, Quant, S <1 <5 mIU/mL    Comment:          GEST. AGE      CONC.   (mIU/mL)   <=1 WEEK        5 - 50     2 WEEKS       50 - 500     3 WEEKS       100 - 10,000     4 WEEKS     1,000 - 30,000     5 WEEKS     3,500 - 115,000   6-8 WEEKS     12,000 - 270,000    12 WEEKS     15,000 - 220,000        FEMALE AND NON-PREGNANT FEMALE:     LESS THAN 5 mIU/mL Performed at [facility], [address], [place], [place] [address]   Prepare RBC (crossmatch)     Status: None   Collection Time: 11/29/22  4:38 PM  Result Value Ref Range   Order Confirmation      ORDER PROCESSED BY BLOOD BANK Performed at [facility], [address], [place], [place] [address]   Type and screen [facility]     Status: None   Collection Time: 11/29/22  4:46 PM  Result  Value Ref Range   ABO/RH(D) B POS    Antibody Screen NEG    Sample Expiration 12/02/2022,2359    Unit Number [id number]    Blood Component Type RBC LR PHER2    Unit division 00    Status of Unit ISSUED,FINAL    Transfusion Status OK TO TRANSFUSE    Crossmatch Result Compatible    Unit Number [id number]    Blood Component Type RED CELLS,LR    Unit division 00    Status of Unit ISSUED,FINAL    Transfusion Status OK TO TRANSFUSE    Crossmatch Result      Compatible Performed at [facility], [address] [place], [place] [address]   BPAM RBC     Status: None   Collection Time: 11/29/22  4:46 PM  Result Value Ref Range   ISSUE DATE / TIME [id number]    Blood Product Unit Number [id number]    PRODUCT CODE [id number]    Unit Type and Rh 7300    Blood Product Expiration Date 425956387564    ISSUE DATE / TIME 332951884166    Blood Product Unit Number [id number]    PRODUCT CODE [id number]    Unit Type and Rh 7300    Blood Product Expiration Date 542706237628   HIV Antibody (routine testing w rflx)     Status: None   Collection Time: 11/29/22 11:33 PM  Result Value Ref Range   HIV Screen 4th Generation wRfx Non Reactive Non Reactive    Comment:  Performed at [facility], [address], [place], [place] [address]  Hemoglobin and hematocrit, blood     Status: Abnormal   Collection Time: 11/29/22 11:33 PM  Result Value Ref Range   Hemoglobin 6.8 (L) 12.0 - 15.0 g/dL   HCT 61.6 (L) 07.3 - 71.0 %    Comment: Performed at [facility], [address] [place], [place] [address]  Hemoglobin A1c     Status: Abnormal   Collection Time: 11/29/22 11:33 PM  Result Value Ref Range   Hgb A1c MFr Bld 7.1 (H) 4.8 - 5.6 %    Comment: (NOTE) Pre diabetes:          5.7%-6.4%  Diabetes:              >6.4%  Glycemic control for   <7.0% adults with diabetes    Mean Plasma Glucose 157.07 mg/dL    Comment: Performed at [facility], [address], [place], [place] [address]  CBG monitoring, ED     Status: Abnormal   Collection Time: 11/29/22 11:34 PM  Result Value Ref Range   Glucose-Capillary 106 (H) 70 - 99 mg/dL    Comment: Glucose reference range applies only to samples taken after fasting for at least 8 hours.  Basic metabolic panel     Status: Abnormal   Collection Time: 11/30/22  4:14 AM  Result Value Ref Range   Sodium 135 135 - 145 mmol/L   Potassium 3.5 3.5 - 5.1 mmol/L   Chloride 106 98 - 111 mmol/L   CO2 19 (L) 22 - 32 mmol/L   Glucose, Bld 126 (H) 70 - 99 mg/dL    Comment: Glucose reference range applies only to samples taken after fasting for at least 8 hours.   BUN 16 6 - 20 mg/dL   Creatinine, Ser 7.03 0.44 - 1.00 mg/dL   Calcium 8.5 (L) 8.9 - 10.3 mg/dL   GFR, Estimated >50 >09 mL/min  Comment: (NOTE) Calculated using the CKD-EPI Creatinine Equation (2021)    Anion gap 10 5 - 15    Comment: Performed at [facility], [address] [place], [place] [address]  CBC     Status: Abnormal   Collection Time: 11/30/22  4:14 AM  Result Value Ref Range   WBC 12.3 (H) 4.0 - 10.5 K/uL   RBC 3.42 (L) 3.87 - 5.11 MIL/uL   Hemoglobin 7.1 (L) 12.0 - 15.0 g/dL    Comment: Reticulocyte  Hemoglobin testing may be clinically indicated, consider ordering this additional test [id number]    HCT 23.0 (L) 36.0 - 46.0 %   MCV 67.3 (L) 80.0 - 100.0 fL   MCH 20.8 (L) 26.0 - 34.0 pg   MCHC 30.9 30.0 - 36.0 g/dL   RDW 11.9 (H) 14.7 - 82.9 %   Platelets 380 150 - 400 K/uL   nRBC 0.3 (H) 0.0 - 0.2 %    Comment: Performed at [facility], [address] [place], [place] [address]  Iron and TIBC     Status: Abnormal   Collection Time: 11/30/22  4:14 AM  Result Value Ref Range   Iron 28 28 - 170 ug/dL   TIBC 086 (H) 578 - 469 ug/dL   Saturation Ratios 6 (L) 10.4 - 31.8 %   UIBC 435 ug/dL    Comment: Performed at [facility], [address] [place], [place] [address]  Ferritin     Status: Abnormal   Collection Time: 11/30/22  4:14 AM  Result Value Ref Range   Ferritin 5 (L) 11 - 307 ng/mL    Comment: Performed at [facility], [address] [place], [place] [address]  Glucose, capillary     Status: Abnormal   Collection Time: 11/30/22  8:44 AM  Result Value Ref Range   Glucose-Capillary 120 (H) 70 - 99 mg/dL    Comment: Glucose reference range applies only to samples taken after fasting for at least 8 hours.  Hemoglobin and hematocrit, blood     Status: Abnormal   Collection Time: 11/30/22 10:03 AM  Result Value Ref Range   Hemoglobin 7.1 (L) 12.0 - 15.0 g/dL   HCT 44.0 (L) 10.2 - 72.5 %    Comment: Performed at [facility], [address], [place], [place] [address]  Prepare RBC (crossmatch)     Status: None   Collection Time: 11/30/22 10:55 AM  Result Value Ref Range   Order Confirmation      ORDER PROCESSED BY BLOOD BANK Performed at [facility], [address] [place], [place] [address]   Glucose, capillary     Status: Abnormal   Collection Time: 11/30/22 11:50 AM  Result Value Ref Range   Glucose-Capillary 123 (H) 70 - 99 mg/dL    Comment: Glucose reference range applies only to samples taken after fasting  for at least 8 hours.  Glucose, capillary     Status: Abnormal   Collection Time: 11/30/22  4:31 PM  Result Value Ref Range   Glucose-Capillary 162 (H) 70 - 99 mg/dL    Comment: Glucose reference range applies only to samples taken after fasting for at least 8 hours.  Hemoglobin and hematocrit, blood     Status: Abnormal   Collection Time: 11/30/22  5:19 PM  Result Value Ref Range   Hemoglobin 8.4 (L) 12.0 - 15.0 g/dL   HCT 25.9 (L) 56.3 - 87.5 %    Comment: Performed at [facility], [address], [place], [place] [address]  Folate     Status: None   Collection Time: 12/07/22 10:37 AM  Result Value Ref Range   Folate 30.0 >5.9 ng/mL    Comment: Performed at [facility], [address] [place], [place] [address]  Vitamin B12     Status: Abnormal   Collection Time: 12/07/22 10:37 AM  Result Value Ref Range   Vitamin B-12 2,062 (H) 180 - 914 pg/mL    Comment: (NOTE) This assay is not validated for testing neonatal or myeloproliferative syndrome specimens for Vitamin B12 levels. Performed at [facility], [address] [place], [place] [address]   CBC with Differential/Platelet     Status: Abnormal   Collection Time: 12/07/22 10:37 AM  Result Value Ref Range   WBC 9.0 4.0 - 10.5 K/uL   RBC 3.68 (L) 3.87 - 5.11 MIL/uL   Hemoglobin 8.1 (L) 12.0 - 15.0 g/dL    Comment: Reticulocyte Hemoglobin testing may be clinically indicated, consider ordering this additional test [id number]    HCT 27.4 (L) 36.0 - 46.0 %   MCV 74.5 (L) 80.0 - 100.0 fL   MCH 22.0 (L) 26.0 - 34.0 pg   MCHC 29.6 (L) 30.0 - 36.0 g/dL   RDW 64.4 (H) 03.4 - 74.2 %   Platelets 255 150 - 400 K/uL   nRBC 0.0 0.0 - 0.2 %   Neutrophils Relative % 69 %   Neutro Abs 6.2 1.7 - 7.7 K/uL   Lymphocytes Relative 20 %   Lymphs Abs 1.8 0.7 - 4.0 K/uL   Monocytes Relative 7 %   Monocytes Absolute 0.6 0.1 - 1.0 K/uL   Eosinophils Relative 3 %   Eosinophils Absolute 0.3 0.0 - 0.5 K/uL    Basophils Relative 1 %   Basophils Absolute 0.1 0.0 - 0.1 K/uL   Immature Granulocytes 0 %   Abs Immature Granulocytes 0.03 0.00 - 0.07 K/uL    Comment: Performed at [facility], [address] [place], [place] [address]  Cytology - PAP     Status: None   Collection Time: 12/17/22  2:16 PM  Result Value Ref Range   High risk HPV Negative    Adequacy      Satisfactory for evaluation; transformation zone component ABSENT.   Diagnosis      - Negative for intraepithelial lesion or malignancy (NILM)   Microorganisms Shift in flora suggestive of bacterial vaginosis    Comment Normal Reference Range HPV - Negative   FSH     Status: None   Collection Time: 12/17/22  2:34 PM  Result Value Ref Range   FSH 7.8 mIU/mL    Comment:                      Adult Female             Range                       Follicular phase      3.5 -  12.5                       Ovulation phase       4.7 -  21.5                       Luteal phase  1.7 -   7.7                       Postmenopausal       25.8 - 134.8   Hemoglobin A1c     Status: Abnormal   Collection Time: 12/28/22  8:47 AM  Result Value Ref Range   Hgb A1c MFr Bld 5.7 (H) 4.8 - 5.6 %    Comment:          Prediabetes: 5.7 - 6.4          Diabetes: >6.4          Glycemic control for adults with diabetes: <7.0    Est. average glucose Bld gHb Est-mCnc 117 mg/dL  TSH     Status: None   Collection Time: 12/28/22  8:47 AM  Result Value Ref Range   TSH 3.470 0.450 - 4.500 uIU/mL  CMP14+EGFR     Status: Abnormal   Collection Time: 12/28/22  8:47 AM  Result Value Ref Range   Glucose 123 (H) 70 - 99 mg/dL   BUN 11 6 - 24 mg/dL   Creatinine, Ser 1.30 0.57 - 1.00 mg/dL   eGFR 865 >78 IO/NGE/9.52   BUN/Creatinine Ratio 16 9 - 23   Sodium 139 134 - 144 mmol/L   Potassium 4.8 3.5 - 5.2 mmol/L   Chloride 104 96 - 106 mmol/L   CO2 20 20 - 29 mmol/L   Calcium 9.9 8.7 - 10.2 mg/dL   Total Protein 6.9 6.0 - 8.5 g/dL   Albumin 4.1 3.9 -  4.9 g/dL   Globulin, Total 2.8 1.5 - 4.5 g/dL   Bilirubin Total <8.4 0.0 - 1.2 mg/dL   Alkaline Phosphatase 66 44 - 121 IU/L   AST 31 0 - 40 IU/L   ALT 28 0 - 32 IU/L  Lipid panel     Status: Abnormal   Collection Time: 12/28/22  8:47 AM  Result Value Ref Range   Cholesterol, Total 191 100 - 199 mg/dL   Triglycerides 132 (H) 0 - 149 mg/dL   HDL 45 >44 mg/dL   VLDL Cholesterol Cal 35 5 - 40 mg/dL   LDL Chol Calc (NIH) 010 (H) 0 - 99 mg/dL   Chol/HDL Ratio 4.2 0.0 - 4.4 ratio    Comment:                                   T. Chol/HDL Ratio                                             Men  Women                               1/2 Avg.Risk  3.4    3.3                                   Avg.Risk  5.0    4.4                                2X Avg.Risk  9.6  7.1                                3X Avg.Risk 23.4   11.0   Basic metabolic panel     Status: Abnormal   Collection Time: 01/14/23  4:20 AM  Result Value Ref Range   Sodium 136 135 - 145 mmol/L   Potassium 4.1 3.5 - 5.1 mmol/L   Chloride 107 98 - 111 mmol/L   CO2 20 (L) 22 - 32 mmol/L   Glucose, Bld 151 (H) 70 - 99 mg/dL    Comment: Glucose reference range applies only to samples taken after fasting for at least 8 hours.   BUN 16 6 - 20 mg/dL   Creatinine, Ser 8.11 0.44 - 1.00 mg/dL   Calcium 8.8 (L) 8.9 - 10.3 mg/dL   GFR, Estimated >91 >47 mL/min    Comment: (NOTE) Calculated using the CKD-EPI Creatinine Equation (2021)    Anion gap 9 5 - 15    Comment: Performed at [facility], [address] [place], [place] [address]  CBC     Status: Abnormal   Collection Time: 01/14/23  4:20 AM  Result Value Ref Range   WBC 8.6 4.0 - 10.5 K/uL   RBC 4.40 3.87 - 5.11 MIL/uL   Hemoglobin 12.0 12.0 - 15.0 g/dL   HCT 21.3 08.6 - 57.8 %   MCV 86.4 80.0 - 100.0 fL   MCH 27.3 26.0 - 34.0 pg   MCHC 31.6 30.0 - 36.0 g/dL   RDW 46.9 (H) 62.9 - 52.8 %   Platelets 337 150 - 400 K/uL   nRBC 0.0 0.0 - 0.2 %    Comment: Performed at  [facility], [address], [place], [place] [address]  Troponin I (High Sensitivity)     Status: None   Collection Time: 01/14/23  4:20 AM  Result Value Ref Range   Troponin I (High Sensitivity) 5 <18 ng/L    Comment: (NOTE) Elevated high sensitivity troponin I (hsTnI) values and significant  changes across serial measurements may suggest ACS but many other  chronic and acute conditions are known to elevate hsTnI results.  Refer to the "Links" section for chest pain algorithms and additional  guidance. Performed at [facility], [address], [place], [place] [address]       Assessment & Plan:  Follow mediterranean diet to decrease LDL.  Keep appointments with specialists.   Problem List Items Addressed This Visit       Endocrine   Hypothyroidism   Type 2 diabetes mellitus without complications (HCC)     Other   Smoking trying to quit   Neck pain - Primary    Return in about 3 months (around 04/28/2023) for with fasting labs prior.   Total time spent: 25 minutes  [employer], NP  01/28/2023   This document may have been prepared by Dragon Voice Recognition software and as such may include unintentional dictation errors.

## 2023-02-04 DIAGNOSIS — L309 Dermatitis, unspecified: Secondary | ICD-10-CM | POA: Diagnosis not present

## 2023-02-04 DIAGNOSIS — L732 Hidradenitis suppurativa: Secondary | ICD-10-CM | POA: Diagnosis not present

## 2023-02-04 DIAGNOSIS — L409 Psoriasis, unspecified: Secondary | ICD-10-CM | POA: Diagnosis not present

## 2023-02-12 ENCOUNTER — Ambulatory Visit
Admission: RE | Admit: 2023-02-12 | Discharge: 2023-02-12 | Disposition: A | Discharge status: Home / Self Care | Payer: Medicaid Other | Source: Ambulatory Visit | Attending: Physician Assistant | Admitting: Physician Assistant

## 2023-02-12 DIAGNOSIS — M4722 Other spondylosis with radiculopathy, cervical region: Secondary | ICD-10-CM | POA: Diagnosis not present

## 2023-02-12 DIAGNOSIS — M5010 Cervical disc disorder with radiculopathy, unspecified cervical region: Secondary | ICD-10-CM | POA: Insufficient documentation

## 2023-02-12 DIAGNOSIS — M4802 Spinal stenosis, cervical region: Secondary | ICD-10-CM | POA: Diagnosis not present

## 2023-02-12 DIAGNOSIS — M4803 Spinal stenosis, cervicothoracic region: Secondary | ICD-10-CM | POA: Diagnosis not present

## 2023-02-12 DIAGNOSIS — R29898 Other symptoms and signs involving the musculoskeletal system: Secondary | ICD-10-CM | POA: Insufficient documentation

## 2023-02-15 ENCOUNTER — Inpatient Hospital Stay: Payer: Medicaid Other

## 2023-02-15 ENCOUNTER — Inpatient Hospital Stay: Payer: Medicaid Other | Attending: Internal Medicine

## 2023-02-15 ENCOUNTER — Inpatient Hospital Stay (HOSPITAL_BASED_OUTPATIENT_CLINIC_OR_DEPARTMENT_OTHER): Payer: Medicaid Other | Admitting: Internal Medicine

## 2023-02-15 ENCOUNTER — Encounter: Payer: Self-pay | Admitting: Internal Medicine

## 2023-02-15 VITALS — BP 140/87 | HR 80 | Temp 98.6°F | Resp 18 | Wt 272.0 lb

## 2023-02-15 VITALS — BP 149/88 | HR 78 | Temp 98.7°F | Resp 18

## 2023-02-15 DIAGNOSIS — D649 Anemia, unspecified: Secondary | ICD-10-CM

## 2023-02-15 DIAGNOSIS — N939 Abnormal uterine and vaginal bleeding, unspecified: Secondary | ICD-10-CM | POA: Diagnosis present

## 2023-02-15 DIAGNOSIS — N921 Excessive and frequent menstruation with irregular cycle: Secondary | ICD-10-CM

## 2023-02-15 DIAGNOSIS — D500 Iron deficiency anemia secondary to blood loss (chronic): Secondary | ICD-10-CM

## 2023-02-15 DIAGNOSIS — D509 Iron deficiency anemia, unspecified: Secondary | ICD-10-CM | POA: Diagnosis present

## 2023-02-15 DIAGNOSIS — N920 Excessive and frequent menstruation with regular cycle: Secondary | ICD-10-CM | POA: Insufficient documentation

## 2023-02-15 LAB — CBC WITH DIFFERENTIAL/PLATELET
Abs Immature Granulocytes: 0.03 10*3/uL (ref 0.00–0.07)
Basophils Absolute: 0.1 10*3/uL (ref 0.0–0.1)
Basophils Relative: 1 %
Eosinophils Absolute: 0.5 10*3/uL (ref 0.0–0.5)
Eosinophils Relative: 6 %
HCT: 37.1 % (ref 36.0–46.0)
Hemoglobin: 12 g/dL (ref 12.0–15.0)
Immature Granulocytes: 0 %
Lymphocytes Relative: 25 %
Lymphs Abs: 2 10*3/uL (ref 0.7–4.0)
MCH: 29.3 pg (ref 26.0–34.0)
MCHC: 32.3 g/dL (ref 30.0–36.0)
MCV: 90.5 fL (ref 80.0–100.0)
Monocytes Absolute: 0.6 10*3/uL (ref 0.1–1.0)
Monocytes Relative: 8 %
Neutro Abs: 4.9 10*3/uL (ref 1.7–7.7)
Neutrophils Relative %: 60 %
Platelets: 318 10*3/uL (ref 150–400)
RBC: 4.1 MIL/uL (ref 3.87–5.11)
RDW: 20.7 % — ABNORMAL HIGH (ref 11.5–15.5)
WBC: 8.1 10*3/uL (ref 4.0–10.5)
nRBC: 0 % (ref 0.0–0.2)

## 2023-02-15 LAB — IRON AND TIBC
Iron: 82 ug/dL (ref 28–170)
Saturation Ratios: 20 % (ref 10.4–31.8)
TIBC: 413 ug/dL (ref 250–450)
UIBC: 331 ug/dL

## 2023-02-15 LAB — FERRITIN: Ferritin: 29 ng/mL (ref 11–307)

## 2023-02-15 MED ORDER — HEPARIN SOD (PORK) LOCK FLUSH 100 UNIT/ML IV SOLN
500.0000 [IU] | Freq: Once | INTRAVENOUS | Status: DC | PRN
  Filled 2023-02-15: qty 5

## 2023-02-15 MED ORDER — SODIUM CHLORIDE 0.9 % IV SOLN
Freq: Once | INTRAVENOUS | Status: DC
  Filled 2023-02-15: qty 250

## 2023-02-15 MED ORDER — SODIUM CHLORIDE 0.9% FLUSH
10.0000 mL | Freq: Once | INTRAVENOUS | Status: AC | PRN
  Administered 2023-02-15: 10 mL
  Filled 2023-02-15: qty 10

## 2023-02-15 MED ORDER — IRON SUCROSE 20 MG/ML IV SOLN
200.0000 mg | Freq: Once | INTRAVENOUS | Status: AC
  Administered 2023-02-15: 200 mg via INTRAVENOUS
  Filled 2023-02-15: qty 10

## 2023-02-15 NOTE — Progress Notes (Signed)
[facility]  [phone] [phone]  ID: Brittany Keller OB: 05-25-1980  MR#: [id number]  KVQ#:[id number]  Patient Care Team: [facility] as PCP - General Michaelyn Barter, MD as Consulting Physician (Oncology)  REFERRING PROVIDER: Dr. Denton Lank  REASON FOR REFERRAL: Iron deficiency anemia  HPI: Brittany Keller is a 42 y.o. female with past medical history of anxiety referred to hematology for management of IDA.  Patient presented to ER in October 2024 with worsening shortness of breath and dizziness.  Was found to have hemoglobin of 6.6.  Received 2 units PRBC. Ferritin of 5.  She reports vaginal bleeding which started about 4 months ago.  Would stop for 2 to 3 days and then would start again.  She has had normal menstrual cycles since menses.  Reports shortness of breath on exertion and dizziness.  Completed IV Venofer 200 mg x 5 doses on 01/02/2023.  Interval history Patient seen today as follow-up post iron infusions for labs. Menstrual bleeding has started again 2 weeks ago which is heavy.  She was seen by [place] [facility] and had exam.  Per patient it was unremarkable.  I do not see any records.  She is feeling better after iron infusions.  REVIEW OF SYSTEMS:   ROS  As per HPI. Otherwise, a complete review of systems is negative.  PAST MEDICAL HISTORY: Past Medical History:  Diagnosis Date   Anxiety    COVID-19     PAST SURGICAL HISTORY: History reviewed. No pertinent surgical history.  FAMILY HISTORY: Family History  Problem Relation Age of Onset   Heart attack Father    Diabetes Maternal Aunt    Diabetes Paternal Aunt    Stroke Paternal Grandmother    BRCA 1/2 Neg Hx    Breast cancer Neg Hx     HEALTH MAINTENANCE: Social History   Tobacco Use   Smoking status: Every Day    Types: Cigarettes   Smokeless tobacco: Never  Vaping Use   Vaping status: Never Used  Substance Use Topics   Alcohol use: No    Drug use: No     No Known Allergies  Current Outpatient Medications  Medication Sig Dispense Refill   clobetasol ointment (TEMOVATE) 0.05 % Apply the medication twice daily to stubborn areas of the skin on the elbow up to five days a week until smooth. Then stop and re-start as the skin changes come back.     methylPREDNISolone (MEDROL DOSEPAK) 4 MG TBPK tablet Take by mouth.     clindamycin (CLEOCIN T) 1 % external solution Apply topically.     clindamycin (CLEOCIN) 150 MG capsule Take 150 mg by mouth 2 (two) times daily as needed.     clonazePAM (KLONOPIN) 0.5 MG tablet Take 0.5 mg by mouth 2 (two) times daily as needed for anxiety. (Patient not taking: Reported on 01/28/2023)     diazepam (VALIUM) 2 MG tablet Take 2 mg by mouth 2 (two) times daily.     gabapentin (NEURONTIN) 300 MG capsule Take 1 capsule (300 mg total) by mouth at bedtime as needed for up to 14 days (nerve pain). 14 capsule 0   hydrocortisone cream 1 % Apply to affected area 2 times daily 30 g 1   levothyroxine (SYNTHROID) 88 MCG tablet Take 88 mcg by mouth every morning.     meloxicam (MOBIC) 15 MG tablet Take 15 mg by mouth 2 (two) times daily.     metFORMIN (GLUCOPHAGE) 1000 MG tablet Take 500  mg by mouth 2 (two) times daily.     ondansetron (ZOFRAN) 4 MG tablet Take 4 mg by mouth every 8 (eight) hours as needed.     No current facility-administered medications for this visit.   Facility-Administered Medications Ordered in Other Visits  Medication Dose Route Frequency Provider Last Rate Last Admin   0.9 %  sodium chloride infusion   Intravenous Once Michaelyn Barter, MD       heparin lock flush 100 unit/mL  500 Units Intracatheter Once PRN Michaelyn Barter, MD        OBJECTIVE: Vitals:   02/15/23 1055  BP: (!) 140/87  Pulse: 80  Resp: 18  Temp: 98.6 F (37 C)  SpO2: 100%     Body mass index is 43.9 kg/m.      General: Well-developed, well-nourished, no acute distress. Eyes: Pink conjunctiva, anicteric  sclera. HEENT: Normocephalic, moist mucous membranes, clear oropharnyx. Lungs: Clear to auscultation bilaterally. Heart: Regular rate and rhythm. No rubs, murmurs, or gallops. Abdomen: Soft, nontender, nondistended. No organomegaly noted, normoactive bowel sounds. Musculoskeletal: No edema, cyanosis, or clubbing. Neuro: Alert, answering all questions appropriately. Cranial nerves grossly intact. Skin: No rashes or petechiae noted. Psych: Normal affect. Lymphatics: No cervical, calvicular, axillary or inguinal LAD.   LAB RESULTS:  Lab Results  Component Value Date   NA 136 01/14/2023   K 4.1 01/14/2023   CL 107 01/14/2023   CO2 20 (L) 01/14/2023   GLUCOSE 151 (H) 01/14/2023   BUN 16 01/14/2023   CREATININE 0.73 01/14/2023   CALCIUM 8.8 (L) 01/14/2023   PROT 6.9 12/28/2022   ALBUMIN 4.1 12/28/2022   AST 31 12/28/2022   ALT 28 12/28/2022   ALKPHOS 66 12/28/2022   BILITOT <0.2 12/28/2022   GFRNONAA >60 01/14/2023   GFRAA >60 09/30/2018    Lab Results  Component Value Date   WBC 8.1 02/15/2023   NEUTROABS 4.9 02/15/2023   HGB 12.0 02/15/2023   HCT 37.1 02/15/2023   MCV 90.5 02/15/2023   PLT 318 02/15/2023    Lab Results  Component Value Date   TIBC 463 (H) 11/30/2022   FERRITIN 5 (L) 11/30/2022   IRONPCTSAT 6 (L) 11/30/2022     STUDIES: No results found.  ASSESSMENT AND PLAN:   Brittany Keller is a 42 y.o. female with pmh of anxiety referred to hematology for management of IDA.  #Iron deficiency anemia  #Abnormal uterine bleeding - Oct 2024-presented to ER with shortness of breath and dizziness.  Hemoglobin 6.6.  Received 2 units PRBC.  Ferritin of 5.  Secondary to heavy vaginal bleeding ongoing for 4 months. -s/p IV Venofer x 5 doses completed in November 2024. -Pelvic ultrasound was unremarkable.  Per patient, seen by [facility] and exam was unremarkable.  Menstrual bleeding has started again for the past 2 weeks.  I advised the patient to reach out  to her OB about it. -Hemoglobin has improved to 12.  Iron panel is pending.  I will proceed with IV Venofer 200 mg once today since she is having ongoing bleeding.  Further scheduling will be based on iron panel.  Patient was advised if the bleeding does not stop in the next 2 to 3 weeks to let us know and we will recheck her CBC in between. -Also advised the patient to start oral iron 325 mg supplements 3 times a week with vitamin C.  # Neck pain radiating to arm -Had MRI C-spine.  Management per primary.  Orders Placed This  Encounter  Procedures   Ferritin   Iron and TIBC([facility])   CBC with Differential (Cancer Center Only)   RTC in 3 months for MD visit, labs, Venofer  Patient expressed understanding and was in agreement with this plan. She also understands that She can call clinic at any time with any questions, concerns, or complaints.   I spent a total of 30 minutes reviewing chart data, face-to-face evaluation with the patient, counseling and coordination of care as detailed above.  Michaelyn Barter, MD   02/15/2023 11:37 AM

## 2023-02-15 NOTE — Patient Instructions (Signed)
Start oral iron supplements 1 tablet three times a week. Take it with Vitamin C for better absorption in the morning on empty stomach.

## 2023-02-15 NOTE — Progress Notes (Signed)
Patient has been bleeding for about 2 weeks, very heavy, not as painful. Still has some pain from her pinched nerve, which radiates down her arm. The medication she is on for the pinched nerve is helping with the pain.

## 2023-03-06 ENCOUNTER — Ambulatory Visit (INDEPENDENT_AMBULATORY_CARE_PROVIDER_SITE_OTHER): Payer: Medicaid Other | Admitting: Physician Assistant

## 2023-03-06 DIAGNOSIS — M5010 Cervical disc disorder with radiculopathy, unspecified cervical region: Secondary | ICD-10-CM

## 2023-03-06 DIAGNOSIS — R29898 Other symptoms and signs involving the musculoskeletal system: Secondary | ICD-10-CM

## 2023-03-06 NOTE — Progress Notes (Addendum)
 Called patient today to discuss her MRI of her cervical spine results.  She does have moderate stenosis at C5-6 and continued pain in her neck that radiates to her left upper extremity.  She states this has improved since she was last seen however is still present.  She also notes continued mild left wrist weakness.  I discussed with the patient that when weakness is present that we discussed surgery.  A C5-6 ACDF was discussed, but patient would like to hold off for now.  She inquired about injections.  We are amenable to these and I will place the referral.  I also encouraged the patient to make a physical therapy appointment as soon as she can.  MRI cervical spine:  IMPRESSION: 1. Cervical spondylosis and degenerative disc disease, causing moderate impingement at C5-6 and mild impingement at C4-5. 2. Mild reversal of the normal cervical lordosis with epicenter at the C5-6 level.  I connected with  Delon KATHEE Hales on 03/06/23 via phone and verified that I am speaking with the correct person using two identifiers while I was present in clinic, patient present in her personal residence. We spoke for ten minutes discussing her care.   I discussed the limitations of evaluation and management by telemedicine. The patient expressed understanding and agreed to proceed.

## 2023-03-06 NOTE — Addendum Note (Signed)
 Addended by: Joan Flores on: 03/06/2023 01:12 PM   Modules accepted: Orders

## 2023-03-13 ENCOUNTER — Ambulatory Visit: Payer: Medicaid Other | Admitting: Physician Assistant

## 2023-04-02 DIAGNOSIS — Z79899 Other long term (current) drug therapy: Secondary | ICD-10-CM | POA: Insufficient documentation

## 2023-04-02 DIAGNOSIS — R937 Abnormal findings on diagnostic imaging of other parts of musculoskeletal system: Secondary | ICD-10-CM | POA: Insufficient documentation

## 2023-04-02 DIAGNOSIS — M899 Disorder of bone, unspecified: Secondary | ICD-10-CM | POA: Insufficient documentation

## 2023-04-02 DIAGNOSIS — G894 Chronic pain syndrome: Secondary | ICD-10-CM | POA: Insufficient documentation

## 2023-04-02 DIAGNOSIS — Z789 Other specified health status: Secondary | ICD-10-CM | POA: Insufficient documentation

## 2023-04-02 DIAGNOSIS — Z713 Dietary counseling and surveillance: Secondary | ICD-10-CM | POA: Insufficient documentation

## 2023-04-02 NOTE — Progress Notes (Signed)
(  04/03/2023) NO-SHOW to interventional pain management initial evaluation.

## 2023-04-02 NOTE — Patient Instructions (Incomplete)
 ______________________________________________________________________    New Patients  Welcome to [facility].   Initial Visit The first or initial visit consists of an evaluation only.   Interventional pain management.  We offer therapies other than opioid controlled substances to manage chronic pain. These include, but are not limited to, diagnostic, therapeutic, and palliative specialized injection therapies (i.e.: Epidural Steroids, Facet Blocks, etc.). We specialize in a variety of nerve blocks as well as radiofrequency treatments. We offer pain implant evaluations and trials, as well as follow up management. In addition we also provide a variety joint injections, including Viscosupplementation (AKA: Gel Therapy).  Prescription Pain Medication. We specialize in alternatives to opioids. We can provide evaluations and recommendations for/of pharmacologic therapies based on CDC Guidelines.  We no longer take patients for long-term medication management. We will not be taking over your pain medications.  ______________________________________________________________________      ______________________________________________________________________    Patient Information update  To: All of our patients.  Re: Name change.  It has been made official that our current name, "[facility]"   will soon be changed to "[facility]".   The purpose of this change is to eliminate any confusion created by the concept of our practice being a "Medication Management Pain Clinic". In the past this has led to the misconception that we treat pain primarily by the use of prescription medications.  Nothing can be farther from the truth.   Understanding PAIN MANAGEMENT: To further understand what our practice does, you first have to  understand that "Pain Management" is a subspecialty that requires additional training once a physician has completed their specialty training, which can be in either Anesthesia, Neurology, Psychiatry, or Physical Medicine and Rehabilitation (PMR). Each one of these contributes to the final approach taken by each physician to the management of their patient's pain. To be a "Pain Management Specialist" you must have first completed one of the specialty trainings below.  Anesthesiologists - trained in clinical pharmacology and interventional techniques such as nerve blockade and regional as well as central neuroanatomy. They are trained to block pain before, during, and after surgical interventions.  Neurologists - trained in the diagnosis and pharmacological treatment of complex neurological conditions, such as Multiple Sclerosis, Parkinson's, spinal cord injuries, and other systemic conditions that may be associated with symptoms that may include but are not limited to pain. They tend to rely primarily on the treatment of chronic pain using prescription medications.  Psychiatrist - trained in conditions affecting the psychosocial wellbeing of patients including but not limited to depression, anxiety, schizophrenia, personality disorders, addiction, and other substance use disorders that may be associated with chronic pain. They tend to rely primarily on the treatment of chronic pain using prescription medications.   Physical Medicine and Rehabilitation (PMR) physicians, also known as physiatrists - trained to treat a wide variety of medical conditions affecting the brain, spinal cord, nerves, bones, joints, ligaments, muscles, and tendons. Their training is primarily aimed at treating patients that have suffered injuries that have caused severe physical impairment. Their training is primarily aimed at the physical therapy and rehabilitation of those patients. They may also work alongside orthopedic surgeons  or neurosurgeons using their expertise in assisting surgical patients to recover after their surgeries.  INTERVENTIONAL PAIN MANAGEMENT is sub-subspecialty of Pain Management.  Our physicians are Board-certified in Anesthesia, Pain Management, and Interventional Pain Management.  This meaning that  not only have they been trained and Board-certified in their specialty of Anesthesia, and subspecialty of Pain Management, but they have also received further training in the sub-subspecialty of Interventional Pain Management, in order to become Board-certified as INTERVENTIONAL PAIN MANAGEMENT SPECIALIST.    Mission: Our goal is to use our skills in  INTERVENTIONAL PAIN MANAGEMENT as alternatives to the chronic use of prescription opioid medications for the treatment of pain. To make this more clear, we have changed our name to reflect what we do and offer. We will continue to offer medication management assessment and recommendations, but we will not be taking over any patient's medication management.  ______________________________________________________________________

## 2023-04-03 ENCOUNTER — Ambulatory Visit (HOSPITAL_BASED_OUTPATIENT_CLINIC_OR_DEPARTMENT_OTHER): Payer: Medicaid Other | Admitting: Pain Medicine

## 2023-04-03 DIAGNOSIS — Z79899 Other long term (current) drug therapy: Secondary | ICD-10-CM

## 2023-04-03 DIAGNOSIS — M4722 Other spondylosis with radiculopathy, cervical region: Secondary | ICD-10-CM | POA: Insufficient documentation

## 2023-04-03 DIAGNOSIS — Z87898 Personal history of other specified conditions: Secondary | ICD-10-CM | POA: Insufficient documentation

## 2023-04-03 DIAGNOSIS — M47812 Spondylosis without myelopathy or radiculopathy, cervical region: Secondary | ICD-10-CM | POA: Insufficient documentation

## 2023-04-03 DIAGNOSIS — G542 Cervical root disorders, not elsewhere classified: Secondary | ICD-10-CM | POA: Insufficient documentation

## 2023-04-03 DIAGNOSIS — M5030 Other cervical disc degeneration, unspecified cervical region: Secondary | ICD-10-CM | POA: Insufficient documentation

## 2023-04-03 DIAGNOSIS — Z91199 Patient's noncompliance with other medical treatment and regimen due to unspecified reason: Secondary | ICD-10-CM

## 2023-04-03 DIAGNOSIS — Z789 Other specified health status: Secondary | ICD-10-CM

## 2023-04-03 DIAGNOSIS — G8929 Other chronic pain: Secondary | ICD-10-CM

## 2023-04-03 DIAGNOSIS — R200 Anesthesia of skin: Secondary | ICD-10-CM | POA: Insufficient documentation

## 2023-04-03 DIAGNOSIS — G894 Chronic pain syndrome: Secondary | ICD-10-CM

## 2023-04-03 DIAGNOSIS — M899 Disorder of bone, unspecified: Secondary | ICD-10-CM

## 2023-04-03 DIAGNOSIS — M4802 Spinal stenosis, cervical region: Secondary | ICD-10-CM | POA: Insufficient documentation

## 2023-04-11 NOTE — Progress Notes (Signed)
(  04/15/2023) NO-SHOW to initial pain management evaluation.  Patient called on the day of the encounter to cancel indicating that she was sick and wanted to reschedule.

## 2023-04-15 ENCOUNTER — Ambulatory Visit (HOSPITAL_BASED_OUTPATIENT_CLINIC_OR_DEPARTMENT_OTHER): Payer: Medicaid Other | Admitting: Pain Medicine

## 2023-04-15 DIAGNOSIS — G894 Chronic pain syndrome: Secondary | ICD-10-CM

## 2023-04-15 DIAGNOSIS — Z79899 Other long term (current) drug therapy: Secondary | ICD-10-CM

## 2023-04-15 DIAGNOSIS — Z789 Other specified health status: Secondary | ICD-10-CM

## 2023-04-15 DIAGNOSIS — Z91199 Patient's noncompliance with other medical treatment and regimen due to unspecified reason: Secondary | ICD-10-CM

## 2023-04-15 DIAGNOSIS — G8929 Other chronic pain: Secondary | ICD-10-CM

## 2023-04-15 DIAGNOSIS — M899 Disorder of bone, unspecified: Secondary | ICD-10-CM

## 2023-04-17 ENCOUNTER — Ambulatory Visit: Payer: Medicaid Other | Admitting: Physician Assistant

## 2023-04-30 ENCOUNTER — Ambulatory Visit (INDEPENDENT_AMBULATORY_CARE_PROVIDER_SITE_OTHER): Payer: Medicaid Other | Admitting: Cardiology

## 2023-04-30 ENCOUNTER — Other Ambulatory Visit: Payer: Self-pay | Admitting: Cardiology

## 2023-04-30 ENCOUNTER — Encounter: Payer: Self-pay | Admitting: Cardiology

## 2023-04-30 VITALS — BP 114/74 | HR 82 | Ht 66.0 in | Wt 262.0 lb

## 2023-04-30 DIAGNOSIS — D649 Anemia, unspecified: Secondary | ICD-10-CM | POA: Diagnosis not present

## 2023-04-30 DIAGNOSIS — E119 Type 2 diabetes mellitus without complications: Secondary | ICD-10-CM | POA: Diagnosis not present

## 2023-04-30 DIAGNOSIS — E039 Hypothyroidism, unspecified: Secondary | ICD-10-CM | POA: Diagnosis not present

## 2023-04-30 DIAGNOSIS — Z0130 Encounter for examination of blood pressure without abnormal findings: Secondary | ICD-10-CM

## 2023-04-30 DIAGNOSIS — Z13220 Encounter for screening for lipoid disorders: Secondary | ICD-10-CM | POA: Diagnosis not present

## 2023-04-30 LAB — POCT UA - MICROALBUMIN
Albumin/Creatinine Ratio, Urine, POC: <30
Creatinine, POC: 200 mg/dL
Microalbumin Ur, POC: 80 mg/L

## 2023-04-30 MED ORDER — MOUNJARO 2.5 MG/0.5ML ~~LOC~~ SOAJ: 2.5000 mg | SUBCUTANEOUS | 3 refills | Status: DC

## 2023-04-30 MED ORDER — WEGOVY 0.25 MG/0.5ML ~~LOC~~ SOAJ: 0.2500 mg | SUBCUTANEOUS | 3 refills | Status: DC

## 2023-04-30 NOTE — Progress Notes (Signed)
 Established Patient Office Visit  Subjective:  Patient ID: Brittany Keller, female    DOB: [birth year]  Age: 43 y.o. MRN: [id number]  Chief Complaint  Patient presents with   Follow-up    3 Months Follow Up    Patient in office for 3 month follow up, discuss recent lab results. Patient reports feeling well overall, no new complaints today.  Continues to smoke 1/2 pack a day, working on quitting.  Patient did not have blood work done, fasting today, will get labs today.  Urine microalbumin today.  Patient requesting a GLP-1 medication for her DM and for weight loss. Will send in [place].     No other concerns at this time.   Past Medical History:  Diagnosis Date   Anxiety    COVID-19     History reviewed. No pertinent surgical history.  Social History   Socioeconomic History   Marital status: Divorced    Spouse name: Not on file   Number of children: Not on file   Years of education: Not on file   Highest education level: Not on file  Occupational History   Not on file  Tobacco Use   Smoking status: Every Day    Types: Cigarettes   Smokeless tobacco: Never  Vaping Use   Vaping status: Never Used  Substance and Sexual Activity   Alcohol use: No   Drug use: No   Sexual activity: Not Currently    Birth control/protection: Surgical    Comment: BTL  Other Topics Concern   Not on file  Social History Narrative   Not on file   Social Drivers of Health   Financial Resource Strain: Not on file  Food Insecurity: No Food Insecurity (12/07/2022)   Hunger Vital Sign    Worried About Running Out of Food in the Last Year: Never true    Ran Out of Food in the Last Year: Never true  Transportation Needs: No Transportation Needs (12/07/2022)   PRAPARE - Administrator, Civil Service (Medical): No    Lack of Transportation (Non-Medical): No  Physical Activity: Not on file  Stress: Not on file  Social Connections: Not on file  Intimate Partner  Violence: Not At Risk (12/07/2022)   Humiliation, Afraid, Rape, and Kick questionnaire    Fear of Current or Ex-Partner: No    Emotionally Abused: No    Physically Abused: No    Sexually Abused: No    Family History  Problem Relation Age of Onset   Heart attack Father    Diabetes Maternal Aunt    Diabetes Paternal Aunt    Stroke Paternal Grandmother    BRCA 1/2 Neg Hx    Breast cancer Neg Hx     No Known Allergies  Outpatient Medications Prior to Visit  Medication Sig   clobetasol ointment (TEMOVATE) 0.05 % Apply the medication twice daily to stubborn areas of the skin on the elbow up to five days a week until smooth. Then stop and re-start as the skin changes come back.   clonazePAM (KLONOPIN) 0.5 MG tablet Take 0.5 mg by mouth 2 (two) times daily as needed for anxiety.   cyclobenzaprine (FLEXERIL) 10 MG tablet Take 10 mg by mouth 2 (two) times daily as needed.   diazepam (VALIUM) 2 MG tablet Take 2 mg by mouth 2 (two) times daily.   gabapentin (NEURONTIN) 600 MG tablet Take 600 mg by mouth 3 (three) times daily.   hydrocortisone cream 1 % Apply  to affected area 2 times daily   levothyroxine (SYNTHROID) 88 MCG tablet Take 88 mcg by mouth every morning.   meloxicam (MOBIC) 15 MG tablet Take 1 tablet by mouth daily.   metFORMIN (GLUCOPHAGE) 1000 MG tablet Take 500 mg by mouth 2 (two) times daily.   ondansetron (ZOFRAN) 4 MG tablet Take 4 mg by mouth every 8 (eight) hours as needed.   [DISCONTINUED] benzonatate (TESSALON) 100 MG capsule Take 200 mg by mouth every 8 (eight) hours as needed. (Patient not taking: Reported on 04/30/2023)   [DISCONTINUED] brompheniramine-pseudoephedrine-DM 30-2-10 MG/5ML syrup  (Patient not taking: Reported on 04/30/2023)   [DISCONTINUED] clindamycin (CLEOCIN T) 1 % external solution Apply topically.   [DISCONTINUED] clindamycin (CLEOCIN) 150 MG capsule Take 150 mg by mouth 2 (two) times daily as needed.   [DISCONTINUED] clindamycin (CLEOCIN) 150 MG capsule  Take by mouth.   [DISCONTINUED] clonazePAM (KLONOPIN) 0.5 MG tablet Take by mouth.   [DISCONTINUED] gabapentin (NEURONTIN) 300 MG capsule Take 1 capsule (300 mg total) by mouth at bedtime as needed for up to 14 days (nerve pain).   [DISCONTINUED] gabapentin (NEURONTIN) 300 MG capsule Take by mouth.   [DISCONTINUED] Hydrocortisone, Perianal, 1 % CREA    [DISCONTINUED] lidocaine (LIDODERM) 5 %    [DISCONTINUED] meloxicam (MOBIC) 15 MG tablet Take 15 mg by mouth 2 (two) times daily.   [DISCONTINUED] metFORMIN (GLUCOPHAGE) 1000 MG tablet Take by mouth.   [DISCONTINUED] methylPREDNISolone (MEDROL DOSEPAK) 4 MG TBPK tablet Take by mouth.   [DISCONTINUED] oseltamivir (TAMIFLU) 75 MG capsule Take 75 mg by mouth 2 (two) times daily.   [DISCONTINUED] zolpidem (AMBIEN) 5 MG tablet Take by mouth.   No facility-administered medications prior to visit.    Review of Systems  Constitutional: Negative.   HENT: Negative.    Eyes: Negative.   Respiratory: Negative.  Negative for shortness of breath.   Cardiovascular: Negative.  Negative for chest pain.  Gastrointestinal: Negative.  Negative for abdominal pain, constipation and diarrhea.  Genitourinary: Negative.   Musculoskeletal:  Negative for joint pain and myalgias.  Skin: Negative.   Neurological: Negative.  Negative for dizziness and headaches.  Endo/Heme/Allergies: Negative.   All other systems reviewed and are negative.      Objective:   BP 114/74   Pulse 82   Ht 5\' 6"  (1.676 m)   Wt 262 lb (118.8 kg)   SpO2 96%   BMI 42.29 kg/m   Vitals:   04/30/23 0908  BP: 114/74  Pulse: 82  Height: 5\' 6"  (1.676 m)  Weight: 262 lb (118.8 kg)  SpO2: 96%  BMI (Calculated): 42.31    Physical Exam Vitals and nursing note reviewed.  Constitutional:      Appearance: Normal appearance. She is normal weight.  HENT:     Head: Normocephalic and atraumatic.     Nose: Nose normal.     Mouth/Throat:     Mouth: Mucous membranes are moist.  Eyes:      Extraocular Movements: Extraocular movements intact.     Conjunctiva/sclera: Conjunctivae normal.     Pupils: Pupils are equal, round, and reactive to light.  Cardiovascular:     Rate and Rhythm: Normal rate and regular rhythm.     Pulses: Normal pulses.     Heart sounds: Normal heart sounds.  Pulmonary:     Effort: Pulmonary effort is normal.     Breath sounds: Normal breath sounds.  Abdominal:     General: Abdomen is flat. Bowel sounds are normal.  Palpations: Abdomen is soft.  Musculoskeletal:        General: Normal range of motion.     Cervical back: Normal range of motion.  Skin:    General: Skin is warm and dry.  Neurological:     General: No focal deficit present.     Mental Status: She is alert and oriented to person, place, and time.  Psychiatric:        Mood and Affect: Mood normal.        Behavior: Behavior normal.        Thought Content: Thought content normal.        Judgment: Judgment normal.      No results found for any visits on 04/30/23.  Recent Results (from the past 2160 hours)  Iron and TIBC([facility])     Status: None   Collection Time: 02/15/23 10:33 AM  Result Value Ref Range   Iron 82 28 - 170 ug/dL   TIBC 782 956 - 213 ug/dL   Saturation Ratios 20 10.4 - 31.8 %   UIBC 331 ug/dL    Comment: Performed at [facility], [address] [place], [place] [address]  Ferritin     Status: None   Collection Time: 02/15/23 10:33 AM  Result Value Ref Range   Ferritin 29 11 - 307 ng/mL    Comment: Performed at [facility], [address] [place], [place] [address]  CBC with Differential/Platelet     Status: Abnormal   Collection Time: 02/15/23 10:33 AM  Result Value Ref Range   WBC 8.1 4.0 - 10.5 K/uL   RBC 4.10 3.87 - 5.11 MIL/uL   Hemoglobin 12.0 12.0 - 15.0 g/dL   HCT 29.5 28.4 - 13.2 %   MCV 90.5 80.0 - 100.0 fL   MCH 29.3 26.0 - 34.0 pg   MCHC 32.3 30.0 - 36.0 g/dL   RDW 44.0 (H) 10.2 - 72.5 %   Platelets  318 150 - 400 K/uL   nRBC 0.0 0.0 - 0.2 %   Neutrophils Relative % 60 %   Neutro Abs 4.9 1.7 - 7.7 K/uL   Lymphocytes Relative 25 %   Lymphs Abs 2.0 0.7 - 4.0 K/uL   Monocytes Relative 8 %   Monocytes Absolute 0.6 0.1 - 1.0 K/uL   Eosinophils Relative 6 %   Eosinophils Absolute 0.5 0.0 - 0.5 K/uL   Basophils Relative 1 %   Basophils Absolute 0.1 0.0 - 0.1 K/uL   Immature Granulocytes 0 %   Abs Immature Granulocytes 0.03 0.00 - 0.07 K/uL    Comment: Performed at [facility], [address], [place], [place] [address]      Assessment & Plan:  Fasting lab work today. M[place]jaro for DM and weight loss.   Problem List Items Addressed This Visit       Endocrine   Type 2 diabetes mellitus without complications (HCC) - Primary (Chronic)   Relevant Orders   CMP14+EGFR   Hemoglobin A1c   POCT Urine Albumin/Creatinine with ratio [[id number]]   Hypothyroidism   Relevant Orders   TSH     Other   Anemia   Relevant Orders   CBC with Differential/Platelet   Other Visit Diagnoses       Lipid screening       Relevant Orders   Lipid Profile       Return in about 6 weeks (around 06/11/2023).   Total time spent: 25 minutes  [employer], NP  04/30/2023  This document may have been prepared by [employer] Voice Recognition software and as such may include unintentional dictation errors.

## 2023-05-01 ENCOUNTER — Encounter: Payer: Self-pay | Admitting: Cardiology

## 2023-05-01 LAB — CMP14+EGFR
ALT: 38 IU/L — ABNORMAL HIGH (ref 0–32)
AST: 41 IU/L — ABNORMAL HIGH (ref 0–40)
Albumin: 4.4 g/dL (ref 3.9–4.9)
Alkaline Phosphatase: 84 IU/L (ref 44–121)
BUN/Creatinine Ratio: 16 (ref 9–23)
BUN: 12 mg/dL (ref 6–24)
Bilirubin Total: 0.2 mg/dL (ref 0.0–1.2)
CO2: 19 mmol/L — ABNORMAL LOW (ref 20–29)
Calcium: 10.1 mg/dL (ref 8.7–10.2)
Chloride: 100 mmol/L (ref 96–106)
Creatinine, Ser: 0.77 mg/dL (ref 0.57–1.00)
Globulin, Total: 2.7 g/dL (ref 1.5–4.5)
Glucose: 138 mg/dL — ABNORMAL HIGH (ref 70–99)
Potassium: 4.4 mmol/L (ref 3.5–5.2)
Sodium: 137 mmol/L (ref 134–144)
Total Protein: 7.1 g/dL (ref 6.0–8.5)
eGFR: 98 mL/min/{1.73_m2} (ref >59)

## 2023-05-01 LAB — LIPID PANEL
Chol/HDL Ratio: 4.5 ratio — ABNORMAL HIGH (ref 0.0–4.4)
Cholesterol, Total: 219 mg/dL — ABNORMAL HIGH (ref 100–199)
HDL: 49 mg/dL (ref >39)
LDL Chol Calc (NIH): 136 mg/dL — ABNORMAL HIGH (ref 0–99)
Triglycerides: 190 mg/dL — ABNORMAL HIGH (ref 0–149)
VLDL Cholesterol Cal: 34 mg/dL (ref 5–40)

## 2023-05-01 LAB — CBC WITH DIFFERENTIAL/PLATELET
Basophils Absolute: 0.1 10*3/uL (ref 0.0–0.2)
Basos: 1 %
EOS (ABSOLUTE): 0.5 10*3/uL — ABNORMAL HIGH (ref 0.0–0.4)
Eos: 4 %
Hematocrit: 41.9 % (ref 34.0–46.6)
Hemoglobin: 14.1 g/dL (ref 11.1–15.9)
Immature Grans (Abs): 0 10*3/uL (ref 0.0–0.1)
Immature Granulocytes: 0 %
Lymphocytes Absolute: 2.2 10*3/uL (ref 0.7–3.1)
Lymphs: 18 %
MCH: 31.3 pg (ref 26.6–33.0)
MCHC: 33.7 g/dL (ref 31.5–35.7)
MCV: 93 fL (ref 79–97)
Monocytes Absolute: 0.8 10*3/uL (ref 0.1–0.9)
Monocytes: 6 %
Neutrophils Absolute: 8.9 10*3/uL — ABNORMAL HIGH (ref 1.4–7.0)
Neutrophils: 71 %
Platelets: 376 10*3/uL (ref 150–450)
RBC: 4.5 x10E6/uL (ref 3.77–5.28)
RDW: 13.2 % (ref 11.7–15.4)
WBC: 12.5 10*3/uL — ABNORMAL HIGH (ref 3.4–10.8)

## 2023-05-01 LAB — HEMOGLOBIN A1C
Est. average glucose Bld gHb Est-mCnc: 154 mg/dL
Hgb A1c MFr Bld: 7 % — ABNORMAL HIGH (ref 4.8–5.6)

## 2023-05-01 LAB — TSH: TSH: 5.27 u[IU]/mL — ABNORMAL HIGH (ref 0.450–4.500)

## 2023-05-03 ENCOUNTER — Encounter: Payer: Self-pay | Admitting: Cardiology

## 2023-05-06 ENCOUNTER — Other Ambulatory Visit: Payer: Self-pay | Admitting: Cardiology

## 2023-05-06 MED ORDER — SEMAGLUTIDE(0.25 OR 0.5MG/DOS) 2 MG/3ML ~~LOC~~ SOPN: 0.2500 mg | PEN_INJECTOR | SUBCUTANEOUS | 3 refills | Status: DC

## 2023-05-17 ENCOUNTER — Inpatient Hospital Stay: Payer: Medicaid Other | Admitting: Internal Medicine

## 2023-05-17 ENCOUNTER — Inpatient Hospital Stay: Payer: Medicaid Other

## 2023-05-17 ENCOUNTER — Inpatient Hospital Stay: Payer: Medicaid Other | Attending: Internal Medicine

## 2023-05-17 ENCOUNTER — Telehealth: Payer: Self-pay | Admitting: Internal Medicine

## 2023-05-17 NOTE — Telephone Encounter (Signed)
 Patient sent a msg thru answering service that she is sick and to cancel her appt today. Lab /MD/ Infusion. Please call to reschedule [phone].

## 2023-06-04 ENCOUNTER — Encounter: Payer: Self-pay | Admitting: Internal Medicine

## 2023-06-04 ENCOUNTER — Inpatient Hospital Stay (HOSPITAL_BASED_OUTPATIENT_CLINIC_OR_DEPARTMENT_OTHER): Admitting: Internal Medicine

## 2023-06-04 ENCOUNTER — Inpatient Hospital Stay: Attending: Internal Medicine

## 2023-06-04 ENCOUNTER — Inpatient Hospital Stay

## 2023-06-04 VITALS — BP 136/72 | HR 81 | Temp 98.5°F | Resp 18 | Wt 254.0 lb

## 2023-06-04 DIAGNOSIS — Z79899 Other long term (current) drug therapy: Secondary | ICD-10-CM | POA: Diagnosis not present

## 2023-06-04 DIAGNOSIS — D649 Anemia, unspecified: Secondary | ICD-10-CM

## 2023-06-04 DIAGNOSIS — D509 Iron deficiency anemia, unspecified: Secondary | ICD-10-CM | POA: Insufficient documentation

## 2023-06-04 DIAGNOSIS — L732 Hidradenitis suppurativa: Secondary | ICD-10-CM | POA: Diagnosis not present

## 2023-06-04 DIAGNOSIS — D500 Iron deficiency anemia secondary to blood loss (chronic): Secondary | ICD-10-CM

## 2023-06-04 DIAGNOSIS — L409 Psoriasis, unspecified: Secondary | ICD-10-CM | POA: Diagnosis not present

## 2023-06-04 LAB — IRON AND TIBC
Iron: 88 ug/dL (ref 28–170)
Saturation Ratios: 19 % (ref 10.4–31.8)
TIBC: 458 ug/dL — ABNORMAL HIGH (ref 250–450)
UIBC: 370 ug/dL

## 2023-06-04 LAB — CBC WITH DIFFERENTIAL (CANCER CENTER ONLY)
Abs Immature Granulocytes: 0.04 10*3/uL (ref 0.00–0.07)
Basophils Absolute: 0.1 10*3/uL (ref 0.0–0.1)
Basophils Relative: 1 %
Eosinophils Absolute: 0.4 10*3/uL (ref 0.0–0.5)
Eosinophils Relative: 5 %
HCT: 40.4 % (ref 36.0–46.0)
Hemoglobin: 13.5 g/dL (ref 12.0–15.0)
Immature Granulocytes: 0 %
Lymphocytes Relative: 25 %
Lymphs Abs: 2.3 10*3/uL (ref 0.7–4.0)
MCH: 30.9 pg (ref 26.0–34.0)
MCHC: 33.4 g/dL (ref 30.0–36.0)
MCV: 92.4 fL (ref 80.0–100.0)
Monocytes Absolute: 0.6 10*3/uL (ref 0.1–1.0)
Monocytes Relative: 6 %
Neutro Abs: 6 10*3/uL (ref 1.7–7.7)
Neutrophils Relative %: 63 %
Platelet Count: 303 10*3/uL (ref 150–400)
RBC: 4.37 MIL/uL (ref 3.87–5.11)
RDW: 13.5 % (ref 11.5–15.5)
WBC Count: 9.4 10*3/uL (ref 4.0–10.5)
nRBC: 0 % (ref 0.0–0.2)

## 2023-06-04 LAB — FERRITIN: Ferritin: 12 ng/mL (ref 11–307)

## 2023-06-04 NOTE — Progress Notes (Signed)
 Patient is doing well, no new questions or concerns for the doctor today. Diet changes along with ozempic has made her feel better.

## 2023-06-04 NOTE — Progress Notes (Signed)
 [facility]  [phone] [phone]  ID: Brittany Keller OB: [birth year]  MR#: [id number]  NGE#:[id number]  Patient Care Team: [facility] as PCP - General Michaelyn Barter, MD as Consulting Physician (Oncology)  Reason for visit-iron deficiency anemia secondary to heavy vaginal bleeding.  HPI: Brittany Keller is a 43 y.o. female with past medical history of anxiety referred to hematology for management of IDA.  Patient presented to ER in October 2024 with worsening shortness of breath and dizziness.  Was found to have hemoglobin of 6.6.  Received 2 units PRBC. Ferritin of 5.  Reported vaginal bleeding straight for 4 months. She has had normal menstrual cycles since menses.  Reports shortness of breath on exertion and dizziness.  Completed IV Venofer 200 mg x 5 doses on 01/02/2023.  Follows with [facility].  Had pelvic ultrasound which was normal.    Interval history Patient was seen today as follow-up for iron deficiency anemia, labs. She reports irregular menstrual cycles.  There are days when the bleeding is heavy and there are days when it is light.  Is been feeling well overall.  Denies any bleeding in urine or stools.  REVIEW OF SYSTEMS:   ROS  As per HPI. Otherwise, a complete review of systems is negative.  PAST MEDICAL HISTORY: Past Medical History:  Diagnosis Date   Anxiety    COVID-19     PAST SURGICAL HISTORY: History reviewed. No pertinent surgical history.  FAMILY HISTORY: Family History  Problem Relation Age of Onset   Heart attack Father    Diabetes Maternal Aunt    Diabetes Paternal Aunt    Stroke Paternal Grandmother    BRCA 1/2 Neg Hx    Breast cancer Neg Hx     HEALTH MAINTENANCE: Social History   Tobacco Use   Smoking status: Every Day    Types: Cigarettes   Smokeless tobacco: Never  Vaping Use   Vaping status: Never Used  Substance Use Topics   Alcohol use: No   Drug use:  No     No Known Allergies  Current Outpatient Medications  Medication Sig Dispense Refill   clobetasol ointment (TEMOVATE) 0.05 % Apply the medication twice daily to stubborn areas of the skin on the elbow up to five days a week until smooth. Then stop and re-start as the skin changes come back.     clonazePAM (KLONOPIN) 0.5 MG tablet Take 0.5 mg by mouth 2 (two) times daily as needed for anxiety.     cyclobenzaprine (FLEXERIL) 10 MG tablet Take 10 mg by mouth 2 (two) times daily as needed.     diazepam (VALIUM) 2 MG tablet Take 2 mg by mouth 2 (two) times daily.     gabapentin (NEURONTIN) 600 MG tablet Take 600 mg by mouth 3 (three) times daily.     hydrocortisone cream 1 % Apply to affected area 2 times daily 30 g 1   levothyroxine (SYNTHROID) 88 MCG tablet Take 88 mcg by mouth every morning.     meloxicam (MOBIC) 15 MG tablet Take 1 tablet by mouth daily.     metFORMIN (GLUCOPHAGE) 1000 MG tablet Take 500 mg by mouth 2 (two) times daily.     ondansetron (ZOFRAN) 4 MG tablet Take 4 mg by mouth every 8 (eight) hours as needed.     Semaglutide,0.25 or 0.5MG /DOS, (OZEMPIC, 0.25 OR 0.5 MG/DOSE,) 2 MG/1.5ML SOPN Inject 1 mg into the skin once a week.     No  current facility-administered medications for this visit.    OBJECTIVE: Vitals:   06/04/23 1411  BP: 136/72  Pulse: 81  Resp: 18  Temp: 98.5 F (36.9 C)  SpO2: 96%     Body mass index is 41 kg/m.      General: Well-developed, well-nourished, no acute distress. Eyes: Pink conjunctiva, anicteric sclera. HEENT: Normocephalic, moist mucous membranes, clear oropharnyx. Lungs: Clear to auscultation bilaterally. Heart: Regular rate and rhythm. No rubs, murmurs, or gallops. Abdomen: Soft, nontender, nondistended. No organomegaly noted, normoactive bowel sounds. Musculoskeletal: No edema, cyanosis, or clubbing. Neuro: Alert, answering all questions appropriately. Cranial nerves grossly intact. Skin: No rashes or petechiae  noted. Psych: Normal affect. Lymphatics: No cervical, calvicular, axillary or inguinal LAD.   LAB RESULTS:  Lab Results  Component Value Date   NA 137 04/30/2023   K 4.4 04/30/2023   CL 100 04/30/2023   CO2 19 (L) 04/30/2023   GLUCOSE 138 (H) 04/30/2023   BUN 12 04/30/2023   CREATININE 0.77 04/30/2023   CALCIUM 10.1 04/30/2023   PROT 7.1 04/30/2023   ALBUMIN 4.4 04/30/2023   AST 41 (H) 04/30/2023   ALT 38 (H) 04/30/2023   ALKPHOS 84 04/30/2023   BILITOT 0.2 04/30/2023   GFRNONAA >60 01/14/2023   GFRAA >60 09/30/2018    Lab Results  Component Value Date   WBC 9.4 06/04/2023   NEUTROABS 6.0 06/04/2023   HGB 13.5 06/04/2023   HCT 40.4 06/04/2023   MCV 92.4 06/04/2023   PLT 303 06/04/2023    Lab Results  Component Value Date   TIBC 413 02/15/2023   TIBC 463 (H) 11/30/2022   FERRITIN 29 02/15/2023   FERRITIN 5 (L) 11/30/2022   IRONPCTSAT 20 02/15/2023   IRONPCTSAT 6 (L) 11/30/2022     STUDIES: No results found.  ASSESSMENT AND PLAN:   Brittany Keller is a 43 y.o. female with pmh of anxiety referred to hematology for management of IDA.  #Iron deficiency anemia  #Abnormal uterine bleeding - Oct 2024-presented to ER with shortness of breath and dizziness.  Hemoglobin 6.6.  Received 2 units PRBC.  Ferritin of 5.  Secondary to heavy vaginal bleeding ongoing for 4 months. -s/p IV Venofer x 5 doses completed in November 2024.  Hemoglobin today is normal at 13.2.  Iron panel is pending.  If low, will reach out to her to schedule iron infusions.  Could not tolerate oral iron due to upset stomach. -Having irregular menstrual cycles with intermittent heavy bleeding.  Follows with the [facility]. Pelvic US neg.   Orders Placed This Encounter  Procedures   CBC with Differential (Cancer Center Only)   Iron and TIBC([facility])   Ferritin   RTC in 6 months for MD visit, labs, Venofer  Patient expressed understanding and was in agreement with this  plan. She also understands that She can call clinic at any time with any questions, concerns, or complaints.   I spent a total of 25 minutes reviewing chart data, face-to-face evaluation with the patient, counseling and coordination of care as detailed above.  Michaelyn Barter, MD   06/04/2023 3:21 PM

## 2023-06-04 NOTE — Progress Notes (Signed)
No Venofer today.

## 2023-06-11 ENCOUNTER — Ambulatory Visit: Admitting: Cardiology

## 2023-06-11 ENCOUNTER — Encounter: Payer: Self-pay | Admitting: Cardiology

## 2023-06-11 VITALS — BP 128/80 | HR 81 | Ht 66.0 in | Wt 252.6 lb

## 2023-06-11 DIAGNOSIS — E119 Type 2 diabetes mellitus without complications: Secondary | ICD-10-CM

## 2023-06-11 DIAGNOSIS — Z0130 Encounter for examination of blood pressure without abnormal findings: Secondary | ICD-10-CM

## 2023-06-11 DIAGNOSIS — Z713 Dietary counseling and surveillance: Secondary | ICD-10-CM | POA: Diagnosis not present

## 2023-06-11 MED ORDER — OZEMPIC (0.25 OR 0.5 MG/DOSE) 2 MG/1.5ML ~~LOC~~ SOPN: 0.5000 mg | PEN_INJECTOR | SUBCUTANEOUS | 6 refills | Status: DC

## 2023-06-11 NOTE — Progress Notes (Signed)
 Established Patient Office Visit  Subjective:  Patient ID: Brittany Keller, female    DOB: 22-Sep-1980  Age: 43 y.o. MRN: [id number]  Chief Complaint  Patient presents with   Follow-up    6 weeks follow up    Patient in office for 6 week follow up. Patient started on Ozempic for DM and weight loss. Patient tolerating Ozempic 0.25 mg weekly. Will increase to 0.5 mg weekly.  Patient continues to smoke, however, she has cut back significantly.  Patient has made dietary changes and is successfully losing weight.     No other concerns at this time.   Past Medical History:  Diagnosis Date   Anxiety    COVID-19     History reviewed. No pertinent surgical history.  Social History   Socioeconomic History   Marital status: Divorced    Spouse name: Not on file   Number of children: Not on file   Years of education: Not on file   Highest education level: Not on file  Occupational History   Not on file  Tobacco Use   Smoking status: Every Day    Types: Cigarettes   Smokeless tobacco: Never  Vaping Use   Vaping status: Never Used  Substance and Sexual Activity   Alcohol use: No   Drug use: No   Sexual activity: Not Currently    Birth control/protection: Surgical    Comment: BTL  Other Topics Concern   Not on file  Social History Narrative   Not on file   Social Drivers of Health   Financial Resource Strain: Not on file  Food Insecurity: No Food Insecurity (12/07/2022)   Hunger Vital Sign    Worried About Running Out of Food in the Last Year: Never true    Ran Out of Food in the Last Year: Never true  Transportation Needs: No Transportation Needs (12/07/2022)   PRAPARE - Administrator, Civil Service (Medical): No    Lack of Transportation (Non-Medical): No  Physical Activity: Not on file  Stress: Not on file  Social Connections: Not on file  Intimate Partner Violence: Not At Risk (12/07/2022)   Humiliation, Afraid, Rape, and Kick questionnaire     Fear of Current or Ex-Partner: No    Emotionally Abused: No    Physically Abused: No    Sexually Abused: No    Family History  Problem Relation Age of Onset   Heart attack Father    Diabetes Maternal Aunt    Diabetes Paternal Aunt    Stroke Paternal Grandmother    BRCA 1/2 Neg Hx    Breast cancer Neg Hx     No Known Allergies  Outpatient Medications Prior to Visit  Medication Sig   clobetasol ointment (TEMOVATE) 0.05 % Apply the medication twice daily to stubborn areas of the skin on the elbow up to five days a week until smooth. Then stop and re-start as the skin changes come back.   clonazePAM (KLONOPIN) 0.5 MG tablet Take 0.5 mg by mouth 2 (two) times daily as needed for anxiety.   cyclobenzaprine (FLEXERIL) 10 MG tablet Take 10 mg by mouth 2 (two) times daily as needed.   gabapentin (NEURONTIN) 600 MG tablet Take 600 mg by mouth 3 (three) times daily.   hydrocortisone cream 1 % Apply to affected area 2 times daily   levothyroxine (SYNTHROID) 88 MCG tablet Take 88 mcg by mouth every morning.   meloxicam (MOBIC) 15 MG tablet Take 1 tablet by mouth  daily.   metFORMIN (GLUCOPHAGE) 1000 MG tablet Take 500 mg by mouth 2 (two) times daily.   ondansetron (ZOFRAN) 4 MG tablet Take 4 mg by mouth every 8 (eight) hours as needed.   [DISCONTINUED] Semaglutide,0.25 or 0.5MG /DOS, (OZEMPIC, 0.25 OR 0.5 MG/DOSE,) 2 MG/1.5ML SOPN Inject 1 mg into the skin once a week.   [DISCONTINUED] diazepam (VALIUM) 2 MG tablet Take 2 mg by mouth 2 (two) times daily. (Patient not taking: Reported on 06/11/2023)   No facility-administered medications prior to visit.    Review of Systems  Constitutional: Negative.   HENT: Negative.    Eyes: Negative.   Respiratory: Negative.  Negative for shortness of breath.   Cardiovascular: Negative.  Negative for chest pain.  Gastrointestinal: Negative.  Negative for abdominal pain, constipation and diarrhea.  Genitourinary: Negative.   Musculoskeletal:  Negative  for joint pain and myalgias.  Skin: Negative.   Neurological: Negative.  Negative for dizziness and headaches.  Endo/Heme/Allergies: Negative.   All other systems reviewed and are negative.      Objective:   BP 128/80   Pulse 81   Ht 5\' 6"  (1.676 m)   Wt 252 lb 9.6 oz (114.6 kg)   SpO2 96%   BMI 40.77 kg/m   Vitals:   06/11/23 0928  BP: 128/80  Pulse: 81  Height: 5\' 6"  (1.676 m)  Weight: 252 lb 9.6 oz (114.6 kg)  SpO2: 96%  BMI (Calculated): 40.79    Physical Exam Vitals and nursing note reviewed.  Constitutional:      Appearance: Normal appearance. She is normal weight.  HENT:     Head: Normocephalic and atraumatic.     Nose: Nose normal.     Mouth/Throat:     Mouth: Mucous membranes are moist.  Eyes:     Extraocular Movements: Extraocular movements intact.     Conjunctiva/sclera: Conjunctivae normal.     Pupils: Pupils are equal, round, and reactive to light.  Cardiovascular:     Rate and Rhythm: Normal rate and regular rhythm.     Pulses: Normal pulses.     Heart sounds: Normal heart sounds.  Pulmonary:     Effort: Pulmonary effort is normal.     Breath sounds: Normal breath sounds.  Abdominal:     General: Abdomen is flat. Bowel sounds are normal.     Palpations: Abdomen is soft.  Musculoskeletal:        General: Normal range of motion.     Cervical back: Normal range of motion.  Skin:    General: Skin is warm and dry.  Neurological:     General: No focal deficit present.     Mental Status: She is alert and oriented to person, place, and time.  Psychiatric:        Mood and Affect: Mood normal.        Behavior: Behavior normal.        Thought Content: Thought content normal.        Judgment: Judgment normal.      No results found for any visits on 06/11/23.  Recent Results (from the past 2160 hours)  POCT Urine Albumin/Creatinine with ratio [[id number]]     Status: Normal   Collection Time: 04/30/23  9:36 AM  Result Value Ref Range    Microalbumin Ur, POC 80 mg/L   Creatinine, POC 200 mg/dL   Albumin/Creatinine Ratio, Urine, POC <30   CMP14+EGFR     Status: Abnormal   Collection Time: 04/30/23  9:38 AM  Result Value Ref Range   Glucose 138 (H) 70 - 99 mg/dL   BUN 12 6 - 24 mg/dL   Creatinine, Ser 1.61 0.57 - 1.00 mg/dL   eGFR 98 >09 UE/AVW/0.98   BUN/Creatinine Ratio 16 9 - 23   Sodium 137 134 - 144 mmol/L   Potassium 4.4 3.5 - 5.2 mmol/L   Chloride 100 96 - 106 mmol/L   CO2 19 (L) 20 - 29 mmol/L   Calcium 10.1 8.7 - 10.2 mg/dL   Total Protein 7.1 6.0 - 8.5 g/dL   Albumin 4.4 3.9 - 4.9 g/dL   Globulin, Total 2.7 1.5 - 4.5 g/dL   Bilirubin Total 0.2 0.0 - 1.2 mg/dL   Alkaline Phosphatase 84 44 - 121 IU/L   AST 41 (H) 0 - 40 IU/L   ALT 38 (H) 0 - 32 IU/L  Lipid Profile     Status: Abnormal   Collection Time: 04/30/23  9:38 AM  Result Value Ref Range   Cholesterol, Total 219 (H) 100 - 199 mg/dL   Triglycerides 119 (H) 0 - 149 mg/dL   HDL 49 >14 mg/dL   VLDL Cholesterol Cal 34 5 - 40 mg/dL   LDL Chol Calc (NIH) 782 (H) 0 - 99 mg/dL   Chol/HDL Ratio 4.5 (H) 0.0 - 4.4 ratio    Comment:                                   T. Chol/HDL Ratio                                             Men  Women                               1/2 Avg.Risk  3.4    3.3                                   Avg.Risk  5.0    4.4                                2X Avg.Risk  9.6    7.1                                3X Avg.Risk 23.4   11.0   Hemoglobin A1c     Status: Abnormal   Collection Time: 04/30/23  9:38 AM  Result Value Ref Range   Hgb A1c MFr Bld 7.0 (H) 4.8 - 5.6 %    Comment:          Prediabetes: 5.7 - 6.4          Diabetes: >6.4          Glycemic control for adults with diabetes: <7.0    Est. average glucose Bld gHb Est-mCnc 154 mg/dL  TSH     Status: Abnormal   Collection Time: 04/30/23  9:38 AM  Result Value Ref Range   TSH 5.270 (H) 0.450 - 4.500 uIU/mL  CBC with Differential/Platelet     Status: Abnormal  Collection  Time: 04/30/23  9:39 AM  Result Value Ref Range   WBC 12.5 (H) 3.4 - 10.8 x10E3/uL   RBC 4.50 3.77 - 5.28 x10E6/uL   Hemoglobin 14.1 11.1 - 15.9 g/dL   Hematocrit 16.1 09.6 - 46.6 %   MCV 93 79 - 97 fL   MCH 31.3 26.6 - 33.0 pg   MCHC 33.7 31.5 - 35.7 g/dL   RDW 04.5 40.9 - 81.1 %   Platelets 376 150 - 450 x10E3/uL   Neutrophils 71 Not Estab. %   Lymphs 18 Not Estab. %   Monocytes 6 Not Estab. %   Eos 4 Not Estab. %   Basos 1 Not Estab. %   Neutrophils Absolute 8.9 (H) 1.4 - 7.0 x10E3/uL   Lymphocytes Absolute 2.2 0.7 - 3.1 x10E3/uL   Monocytes Absolute 0.8 0.1 - 0.9 x10E3/uL   EOS (ABSOLUTE) 0.5 (H) 0.0 - 0.4 x10E3/uL   Basophils Absolute 0.1 0.0 - 0.2 x10E3/uL   Immature Granulocytes 0 Not Estab. %   Immature Grans (Abs) 0.0 0.0 - 0.1 x10E3/uL  CBC with Differential (Cancer Center Only)     Status: None   Collection Time: 06/04/23  1:48 PM  Result Value Ref Range   WBC Count 9.4 4.0 - 10.5 K/uL   RBC 4.37 3.87 - 5.11 MIL/uL   Hemoglobin 13.5 12.0 - 15.0 g/dL   HCT 91.4 78.2 - 95.6 %   MCV 92.4 80.0 - 100.0 fL   MCH 30.9 26.0 - 34.0 pg   MCHC 33.4 30.0 - 36.0 g/dL   RDW 21.3 08.6 - 57.8 %   Platelet Count 303 150 - 400 K/uL   nRBC 0.0 0.0 - 0.2 %   Neutrophils Relative % 63 %   Neutro Abs 6.0 1.7 - 7.7 K/uL   Lymphocytes Relative 25 %   Lymphs Abs 2.3 0.7 - 4.0 K/uL   Monocytes Relative 6 %   Monocytes Absolute 0.6 0.1 - 1.0 K/uL   Eosinophils Relative 5 %   Eosinophils Absolute 0.4 0.0 - 0.5 K/uL   Basophils Relative 1 %   Basophils Absolute 0.1 0.0 - 0.1 K/uL   Immature Granulocytes 0 %   Abs Immature Granulocytes 0.04 0.00 - 0.07 K/uL    Comment: Performed at [facility], [address] [place], [place] [address]  Iron and TIBC([facility])     Status: Abnormal   Collection Time: 06/04/23  1:48 PM  Result Value Ref Range   Iron 88 28 - 170 ug/dL   TIBC 952 (H) 841 - 324 ug/dL   Saturation Ratios 19 10.4 - 31.8 %   UIBC 370 ug/dL    Comment:  Performed at [facility], [address] [place], [place] [address]  Ferritin     Status: None   Collection Time: 06/04/23  1:48 PM  Result Value Ref Range   Ferritin 12 11 - 307 ng/mL    Comment: Performed at [facility], [address] [place], [place] [address]      Assessment & Plan:  Increase Ozempic to 0.5 mg weekly.  Problem List Items Addressed This Visit       Endocrine   Type 2 diabetes mellitus without complications (HCC) - Primary (Chronic)   Relevant Medications   Semaglutide,0.25 or 0.5MG /DOS, (OZEMPIC, 0.25 OR 0.5 MG/DOSE,) 2 MG/1.5ML SOPN     Other   Weight loss counseling, encounter for    Return in about 3 months (around 09/10/2023) for with fasting  labs prior.   Total time spent: 25 minutes  [employer], NP  06/11/2023   This document may have been prepared by Dragon Voice Recognition software and as such may include unintentional dictation errors.

## 2023-06-26 ENCOUNTER — Telehealth: Payer: Self-pay

## 2023-06-26 ENCOUNTER — Encounter: Payer: Self-pay | Admitting: Oncology

## 2023-06-26 NOTE — Telephone Encounter (Signed)
 The patient is scheduled for next opening, 5/20. Patient declined 5/19 due to monday's do not work for her scheduled.

## 2023-06-26 NOTE — Telephone Encounter (Signed)
 Pt calling to schedule appointment. Last seen Dr. Everardo Hitch and per her note:  Follow up in 1 year for annual exam/prn return of AUB   Please call pt to schedule appt.

## 2023-07-16 ENCOUNTER — Other Ambulatory Visit (HOSPITAL_COMMUNITY)
Admission: RE | Admit: 2023-07-16 | Discharge: 2023-07-16 | Disposition: A | Discharge status: Home / Self Care | Source: Ambulatory Visit | Attending: Obstetrics & Gynecology | Admitting: Obstetrics & Gynecology

## 2023-07-16 ENCOUNTER — Ambulatory Visit (INDEPENDENT_AMBULATORY_CARE_PROVIDER_SITE_OTHER): Admitting: Obstetrics & Gynecology

## 2023-07-16 VITALS — BP 136/75 | HR 82 | Ht 66.0 in | Wt 242.8 lb

## 2023-07-16 DIAGNOSIS — E039 Hypothyroidism, unspecified: Secondary | ICD-10-CM | POA: Diagnosis not present

## 2023-07-16 DIAGNOSIS — N879 Dysplasia of cervix uteri, unspecified: Secondary | ICD-10-CM | POA: Diagnosis not present

## 2023-07-16 DIAGNOSIS — N920 Excessive and frequent menstruation with regular cycle: Secondary | ICD-10-CM | POA: Insufficient documentation

## 2023-07-16 DIAGNOSIS — N858 Other specified noninflammatory disorders of uterus: Secondary | ICD-10-CM

## 2023-07-16 DIAGNOSIS — N939 Abnormal uterine and vaginal bleeding, unspecified: Secondary | ICD-10-CM

## 2023-07-16 DIAGNOSIS — Z30011 Encounter for initial prescription of contraceptive pills: Secondary | ICD-10-CM

## 2023-07-16 MED ORDER — LEVOTHYROXINE SODIUM 100 MCG PO TABS: 100.0000 ug | ORAL_TABLET | Freq: Every day | ORAL | 5 refills | Status: AC

## 2023-07-16 NOTE — Progress Notes (Signed)
    GYNECOLOGY PROGRESS NOTE  Subjective:    Patient ID: Brittany Keller, female    DOB: [birth year], 43 y.o.   MRN: [id number]  HPI  Patient is a 43 y.o. [id number] here with continued AUB. I met her 11/2022 for the same issue. At that time I ordered a pelvic ultrasound and it was normal. Her TSH was normal (on synthroid ) at that time. She continues to have heavy bleeding every month for 2 weeks. She has been anemic in the past and had IV iron  infusions. Her HBG is now normal and her TSH is elevated at 5.4 on 88 mcg of synthroid .   She has lost weight on Ozembic recently.  She has not had sex for 3 years but met someone and plans to have sex soon. She started taking OTC POPs last week.  The following portions of the patient's history were reviewed and updated as appropriate: allergies, current medications, past family history, past medical history, past social history, past surgical history, and problem list.  Review of Systems Pertinent items are noted in HPI.  Pap normal 11/2022. Mammogram normal 12/2022  Objective:   Blood pressure 136/75, pulse 82, height 5\' 6"  (1.676 m), weight 242 lb 12.8 oz (110.1 kg), last menstrual period 06/26/2023. Body mass index is 39.19 kg/m. Well nourished, well hydrated White female, no apparent distress She is ambulating and conversing normally.   Consent signed, time out done Cervix prepped with betadine and sprayed with Hurricaine spray. I then grasped with a single tooth tenaculum. Uterus sounded to 9 cm Pipelle used for 2 passes with a large amount of tissue obtained. She tolerated the procedure well.  She had taken Aleve  this morning.   Assessment:   1. Abnormal uterine bleeding (AUB)   2. Hypothyroidism, unspecified type      Plan:   1. Abnormal uterine bleeding (AUB) (Primary) -  await pathology - TSH + free T4; Future  2. Hypothyroidism, unspecified type -   To help her AUB, I will increase her synthroid  to 100 mcg and  recheck TSH in 2 months. - TSH + free T4; Future  3. Contraception- stop the OTC POPs and start taking Slynd - rec condoms at all times to help prevent STI

## 2023-07-17 LAB — SURGICAL PATHOLOGY

## 2023-07-22 ENCOUNTER — Encounter: Payer: Self-pay | Admitting: Obstetrics & Gynecology

## 2023-07-30 ENCOUNTER — Other Ambulatory Visit: Payer: Self-pay | Admitting: Advanced Practice Midwife

## 2023-07-30 ENCOUNTER — Ambulatory Visit (INDEPENDENT_AMBULATORY_CARE_PROVIDER_SITE_OTHER): Admitting: Advanced Practice Midwife

## 2023-07-30 ENCOUNTER — Telehealth: Payer: Self-pay

## 2023-07-30 ENCOUNTER — Encounter: Payer: Self-pay | Admitting: Advanced Practice Midwife

## 2023-07-30 ENCOUNTER — Other Ambulatory Visit: Payer: Self-pay | Admitting: Cardiology

## 2023-07-30 ENCOUNTER — Encounter: Payer: Self-pay | Admitting: Cardiology

## 2023-07-30 VITALS — BP 132/72 | HR 78 | Ht 66.0 in | Wt 236.7 lb

## 2023-07-30 DIAGNOSIS — S3140XA Unspecified open wound of vagina and vulva, initial encounter: Secondary | ICD-10-CM | POA: Diagnosis not present

## 2023-07-30 DIAGNOSIS — N920 Excessive and frequent menstruation with regular cycle: Secondary | ICD-10-CM

## 2023-07-30 DIAGNOSIS — N898 Other specified noninflammatory disorders of vagina: Secondary | ICD-10-CM

## 2023-07-30 DIAGNOSIS — M533 Sacrococcygeal disorders, not elsewhere classified: Secondary | ICD-10-CM

## 2023-07-30 DIAGNOSIS — S31000A Unspecified open wound of lower back and pelvis without penetration into retroperitoneum, initial encounter: Secondary | ICD-10-CM | POA: Diagnosis not present

## 2023-07-30 LAB — POCT URINALYSIS DIPSTICK
Bilirubin, UA: NEGATIVE
Blood, UA: POSITIVE
Glucose, UA: NEGATIVE
Ketones, UA: NEGATIVE
Leukocytes, UA: NEGATIVE
Nitrite, UA: NEGATIVE
Protein, UA: NEGATIVE
Spec Grav, UA: 1.02 (ref 1.010–1.025)
Urobilinogen, UA: 0.2 U/dL
pH, UA: 7 (ref 5.0–8.0)

## 2023-07-30 MED ORDER — SEMAGLUTIDE (1 MG/DOSE) 4 MG/3ML ~~LOC~~ SOPN: 1.0000 mg | PEN_INJECTOR | SUBCUTANEOUS | 5 refills | Status: DC

## 2023-07-30 MED ORDER — SLYND 4 MG PO TABS: 1.0000 | ORAL_TABLET | Freq: Every day | ORAL | 11 refills | Status: DC

## 2023-07-30 NOTE — Progress Notes (Signed)
 Patient ID: Brittany Keller, female   DOB: 1981/02/14, 43 y.o.   MRN: [id number]  Reason for Visit: Vaginal Pain and Dysuria   Subjective:  HPI:  Brittany Keller is a 43 y.o. female being seen for painful areas near vagina and sacrum. She has recently resumed sexual activity after years of abstinence. She noticed the pain after intercourse. They are described as "very painful" and worse with peeing.   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 See 07/22/23 message from Dr Brittany Keller. Patient declines today due to above mentioned pain and she will schedule a separate visit for PAP.  Past Medical History:  Diagnosis Date   Anxiety    COVID-19    Family History  Problem Relation Age of Onset   Heart attack Father    Diabetes Maternal Aunt    Diabetes Paternal Aunt    Stroke Paternal Grandmother    BRCA 1/2 Neg Hx    Breast cancer Neg Hx    No past surgical history on file.  Short Social History:  Social History   Tobacco Use   Smoking status: Every Day    Types: Cigarettes   Smokeless tobacco: Never  Substance Use Topics   Alcohol use: No    No Known Allergies  Current Outpatient Medications  Medication Sig Dispense Refill   clobetasol ointment (TEMOVATE) 0.05 %      clonazePAM  (KLONOPIN ) 0.5 MG tablet Take 0.5 mg by mouth 2 (two) times daily as needed for anxiety.     cyclobenzaprine (FLEXERIL) 10 MG tablet Take 10 mg by mouth 2 (two) times daily as needed.     gabapentin  (NEURONTIN ) 600 MG tablet Take 600 mg by mouth 3 (three) times daily.     hydrocortisone  cream 1 % Apply to affected area 2 times daily 30 g 1   levothyroxine  (SYNTHROID ) 100 MCG tablet Take 1 tablet (100 mcg total) by mouth daily before breakfast. 90 tablet 5   meloxicam (MOBIC) 15 MG tablet Take 1 tablet by mouth daily.     metFORMIN (GLUCOPHAGE) 1000 MG tablet Take 500 mg by mouth 2 (two) times daily.     ondansetron   (ZOFRAN ) 4 MG tablet Take 4 mg by mouth every 8 (eight) hours as needed.     Semaglutide , 1 MG/DOSE, 4 MG/3ML SOPN Inject 1 mg into the skin once a week. 3 mL 5   No current facility-administered medications for this visit.    Review of Systems  Constitutional:  Negative for chills and fever.  HENT:  Negative for congestion, ear discharge, ear pain, hearing loss, sinus pain and sore throat.   Eyes:  Negative for blurred vision and double vision.  Respiratory:  Negative for cough, shortness of breath and wheezing.   Cardiovascular:  Negative for chest pain, palpitations and leg swelling.  Gastrointestinal:  Negative for abdominal pain, blood in stool, constipation, diarrhea, heartburn, melena, nausea and vomiting.  Genitourinary:  Negative for dysuria, flank pain, frequency, hematuria and urgency.       Positive for stinging pain in vaginal and sacral areas  Musculoskeletal:  Negative for back pain, joint pain and myalgias.  Skin:  Negative for itching and rash.  Neurological:  Negative for dizziness, tingling, tremors, sensory change, speech change, focal weakness, seizures, loss of consciousness, weakness and headaches.  Endo/Heme/Allergies:  Negative for environmental allergies. Does not bruise/bleed easily.  Psychiatric/Behavioral:  Negative for depression, hallucinations, memory loss, substance  abuse and suicidal ideas. The patient is not nervous/anxious and does not have insomnia.        Objective:  Objective   Vitals:   07/30/23 1426  BP: 132/72  Pulse: 78  Weight: 236 lb 11.2 oz (107.4 kg)  Height: 5\' 6"  (1.676 m)   Body mass index is 38.2 kg/m. Constitutional: Well nourished, well developed female in no acute distress.  HEENT: normal Skin: Warm and dry.  Cardiovascular: Regular rate and rhythm.   Respiratory:  Normal respiratory effort Psych: Alert and Oriented x3. No memory deficits. Normal mood and affect.    Pelvic exam:  is not limited by body habitus EGBUS:  within normal limits Vagina: 2 cm lesion at bottom of vaginal opening, raw appearing, no exudate or drainage, not infected appearing, scant menstrual blood in the vault Sacrum: 1.5 cm crack of open skin at base of sacrum, no drainage or signs of infection  Data:  Latest Reference Range & Units 07/30/23 14:43  Bilirubin, UA  Negative  Glucose Negative  Negative  Ketones, UA  Negative  Leukocytes,UA Negative  Negative  Nitrite, UA  Negative  pH, UA 5.0 - 8.0  7.0  Protein,UA Negative  Negative  Specific Gravity, UA 1.010 - 1.025  1.020  Urobilinogen, UA 0.2 or 1.0 E.U./dL 0.2  RBC, UA  Positive  RBC likely positive due to menses      Assessment/Plan:     43 y.o. premenopausal female with open wound lesions of vagina and sacrum  Tub soaks; alternate sea salt and apple cider vinegar Apply barrier cream to affected areas after gently drying Avoid harsh or chemical body care products Increase intake of protein, vitamins A and C, and zinc to promote healing Keep blood sugar well controlled for optimum healing Return for worsening symptoms Rx Slynd per Dr Brittany Keller recommendation   Brittany Keller, CNM [facility] 07/30/2023 4:15 PM

## 2023-07-30 NOTE — Patient Instructions (Addendum)
 Tub soaks: alternate sea salt or apple cider vinegar soaks (1 cup), soak for 20 minutes, gently pat dry, apply light layer of barrier ointment such as antibiotic ointment, A and D ointment or coconut oil. May have to reapply barrier to affected areas throughout day.  Use a peri bottle (small spray bottle with warm water) to dilute urine while peeing to avoid burning pain.  Avoid harsh, scented or chemical body care products to the area.  Keep blood sugar at normal range for best wound healing. Include protein, vitamins A and C, and zinc to promote healing. Smoking cessation will aid wound healing  Wound Care, Adult Taking care of your wound properly can help to prevent pain, infection, and scarring. It can also help your wound heal more quickly. Follow instructions from your health care provider about how to care for your wound. Supplies needed: Soap and water. Wound cleanser, saline, or germ-free (sterile) water. Gauze. If needed, a clean bandage (dressing) or other type of wound dressing material to cover or place in the wound. Follow your health care provider's instructions about what dressing supplies to use. Cream or topical ointment to apply to the wound, if told by your health care provider. How to care for your wound Cleaning the wound Ask your health care provider how to clean the wound. This may include: Using mild soap and water, a wound cleanser, saline, or sterile water. Using a clean gauze to pat the wound dry after cleaning it. Do not rub or scrub the wound. Dressing care Wash your hands with soap and water for at least 20 seconds before and after you change the dressing. If soap and water are not available, use hand sanitizer. Change your dressing as told by your health care provider. This may include: Cleaning or rinsing out (irrigating) the wound. Application of cream or topical ointment, if told by your health care provider. Placing a dressing over the wound or in the  wound (packing). Covering the wound with an outer dressing. Leave stitches (sutures), staples, skin glue, or adhesive strips in place. These skin closures may need to stay in place for 2 weeks or longer. If adhesive strip edges start to loosen and curl up, you may trim the loose edges. Do not remove adhesive strips completely unless your health care provider tells you to do that. Ask your health care provider when you can leave the wound uncovered. Checking for infection Check your wound area every day for signs of infection. Check for: More redness, swelling, or pain. Fluid or blood. Warmth. Pus or a bad smell.  Follow these instructions at home Medicines If you were prescribed an antibiotic medicine, cream, or ointment, take or apply it as told by your health care provider. Do not stop using the antibiotic even if your condition improves. If you were prescribed pain medicine, take it 30 minutes before you do any wound care or as told by your health care provider. Take over-the-counter and prescription medicines only as told by your health care provider. Eating and drinking Eat a diet that includes protein, vitamin A, vitamin C, and other nutrient-rich foods to help the wound heal. Foods rich in protein include meat, fish, eggs, dairy, beans, and nuts. Foods rich in vitamin A include carrots and dark green, leafy vegetables. Foods rich in vitamin C include citrus fruits, tomatoes, broccoli, and peppers. Drink enough fluid to keep your urine pale yellow. General instructions Do not take baths, swim, or use a hot tub until your health  care provider approves. Ask your health care provider if you may take showers. You may only be allowed to take sponge baths. Do not scratch or pick at the wound. Keep it covered as told by your health care provider. Return to your normal activities as told by your health care provider. Ask your health care provider what activities are safe for you. Protect your  wound from the sun when you are outside for the first 6 months, or for as long as told by your health care provider. Cover up the scar area or apply sunscreen that has an SPF of at least 30. Do not use any products that contain nicotine or tobacco. These products include cigarettes, chewing tobacco, and vaping devices, such as e-cigarettes. If you need help quitting, ask your health care provider. Keep all follow-up visits. This is important. Contact a health care provider if: You received a tetanus shot and you have swelling, severe pain, redness, or bleeding at the injection site. Your pain is not controlled with medicine. You have any of these signs of infection: More redness, swelling, or pain around the wound. Fluid or blood coming from the wound. Warmth coming from the wound. A fever or chills. You are nauseous or you vomit. You are dizzy. You have a new rash or hardness around the wound. Get help right away if: You have a red streak of skin near the area around your wound. Pus or a bad smell coming from the wound. Your wound has been closed with staples, sutures, skin glue, or adhesive strips and it begins to open up and separate. Your wound is bleeding, and the bleeding does not stop with gentle pressure. These symptoms may represent a serious problem that is an emergency. Do not wait to see if the symptoms will go away. Get medical help right away. Call your local emergency services (911 in the [place] Do not drive yourself to the hospital. Summary Always wash your hands with soap and water for at least 20 seconds before and after changing your dressing. Change your dressing as told by your health care provider. To help with healing, eat foods that are rich in protein, vitamin A, vitamin C, and other nutrients. Check your wound every day for signs of infection. Contact your health care provider if you think that your wound is infected. This information is not intended to replace  advice given to you by your health care provider. Make sure you discuss any questions you have with your health care provider. Document Revised: 06/21/2020 Document Reviewed: 06/21/2020 Elsevier Patient Education  2024 [employer].

## 2023-07-30 NOTE — Progress Notes (Signed)
Increasing dose

## 2023-07-30 NOTE — Telephone Encounter (Signed)
 Patient called about having pain in her vaginal area. She notes that it feels like a scratch or bump and it is very painful. She also feels like she is having some UTI symptoms. I advised patient to come in for evaluation with Provider. She verbalized understanding and called to schedule an appointment.

## 2023-08-12 ENCOUNTER — Ambulatory Visit (INDEPENDENT_AMBULATORY_CARE_PROVIDER_SITE_OTHER): Admitting: Obstetrics & Gynecology

## 2023-08-12 ENCOUNTER — Other Ambulatory Visit (HOSPITAL_COMMUNITY)
Admission: RE | Admit: 2023-08-12 | Discharge: 2023-08-12 | Disposition: A | Discharge status: Home / Self Care | Source: Ambulatory Visit | Attending: Obstetrics & Gynecology | Admitting: Obstetrics & Gynecology

## 2023-08-12 ENCOUNTER — Encounter: Payer: Self-pay | Admitting: Obstetrics & Gynecology

## 2023-08-12 VITALS — BP 111/73 | HR 89 | Ht 66.0 in | Wt 231.0 lb

## 2023-08-12 DIAGNOSIS — Z124 Encounter for screening for malignant neoplasm of cervix: Secondary | ICD-10-CM | POA: Insufficient documentation

## 2023-08-12 DIAGNOSIS — N9089 Other specified noninflammatory disorders of vulva and perineum: Secondary | ICD-10-CM | POA: Diagnosis not present

## 2023-08-12 DIAGNOSIS — R350 Frequency of micturition: Secondary | ICD-10-CM

## 2023-08-12 DIAGNOSIS — N939 Abnormal uterine and vaginal bleeding, unspecified: Secondary | ICD-10-CM | POA: Diagnosis not present

## 2023-08-12 MED ORDER — NORETHINDRONE ACET-ETHINYL EST 1.5-30 MG-MCG PO TABS: 1.0000 | ORAL_TABLET | Freq: Every day | ORAL | 5 refills | Status: AC

## 2023-08-12 NOTE — Progress Notes (Signed)
    GYNECOLOGY PROGRESS NOTE  Subjective:    Patient ID: Brittany Keller, female    DOB: Nov 14, 1980, 43 y.o.   MRN: [id number]  HPI  Patient is a 43 y.o. [id number] here pap and further treatment of her DUB. I did a EMBX last month and result was benign. TSH normal but in the high range (on 88 mcg of synthroid ). I increased her dose to 100 mcg per day and will recheck TSH next month. She has lost 5 pounds since then. She started Slynd  and reports that her bleeding is lighter and less clots but still present. However the Slynd  is expensive.   She has had a 2 week h/o a posterior fourchette split open area since she had sex. It was very painful and now is not painful.  The following portions of the patient's history were reviewed and updated as appropriate: allergies, current medications, past family history, past medical history, past social history, past surgical history, and problem list.  Review of Systems Pertinent items are noted in HPI.   Objective:   Blood pressure 111/73, pulse 89, height 5' 6 (1.676 m), weight 231 lb (104.8 kg), last menstrual period 07/27/2023. Body mass index is 37.28 kg/m. Well nourished, well hydrated White female, no apparent distress She is ambulating and conversing normally. EG- posterior fourchette lesion c/w HSV Speculum placed and cervix has bloody mucous, no active bleeding Pap smear obtained   Assessment:   1. Screening for cervical cancer   2.      Vulvar lesion c/w HSV- she would like to know if this is hsv so I have ordered blood work. 3.      DUB- improved some with Slynd  but too expensive. She has no contraindications to regular OCPs, so I will prescribe lo estrin  She will come back in 2 months/prn sooner  Plan:   As above

## 2023-08-13 ENCOUNTER — Encounter: Payer: Self-pay | Admitting: Obstetrics & Gynecology

## 2023-08-13 ENCOUNTER — Other Ambulatory Visit: Payer: Self-pay | Admitting: Obstetrics & Gynecology

## 2023-08-13 LAB — HSV 1 AND 2 AB, IGG
HSV 1 Glycoprotein G Ab, IgG: NONREACTIVE
HSV 2 IgG, Type Spec: REACTIVE — AB

## 2023-08-13 MED ORDER — VALACYCLOVIR HCL 1 G PO TABS: 1000.0000 mg | ORAL_TABLET | Freq: Every day | ORAL | 6 refills | Status: AC

## 2023-08-13 NOTE — Progress Notes (Signed)
 Valtrex prescribed Mychart message sent.

## 2023-08-14 LAB — URINE CULTURE

## 2023-08-16 LAB — CYTOLOGY - PAP
Comment: NEGATIVE
Diagnosis: NEGATIVE
High risk HPV: NEGATIVE

## 2023-09-10 ENCOUNTER — Encounter: Payer: Self-pay | Admitting: Cardiology

## 2023-09-10 ENCOUNTER — Ambulatory Visit: Admitting: Cardiology

## 2023-09-10 VITALS — BP 114/72 | HR 89 | Ht 66.0 in | Wt 224.0 lb

## 2023-09-10 DIAGNOSIS — Z0130 Encounter for examination of blood pressure without abnormal findings: Secondary | ICD-10-CM

## 2023-09-10 DIAGNOSIS — Z13220 Encounter for screening for lipoid disorders: Secondary | ICD-10-CM | POA: Diagnosis not present

## 2023-09-10 DIAGNOSIS — D500 Iron deficiency anemia secondary to blood loss (chronic): Secondary | ICD-10-CM

## 2023-09-10 DIAGNOSIS — E039 Hypothyroidism, unspecified: Secondary | ICD-10-CM | POA: Diagnosis not present

## 2023-09-10 DIAGNOSIS — E119 Type 2 diabetes mellitus without complications: Secondary | ICD-10-CM

## 2023-09-10 MED ORDER — OZEMPIC (2 MG/DOSE) 8 MG/3ML ~~LOC~~ SOPN: 2.0000 mg | PEN_INJECTOR | SUBCUTANEOUS | 12 refills | Status: DC

## 2023-09-10 NOTE — Progress Notes (Signed)
 Established Patient Office Visit  Subjective:  Patient ID: Brittany Keller, female    DOB: Dec 03, 1980  Age: 43 y.o. MRN: [id number]  Chief Complaint  Patient presents with   Results    3 month lab results    Patient in office for 3 month follow up, discuss recent lab results. Patient doing well, no complaints today. Taking and tolerating medications. Will increase Ozempic  to 2 mg weekly. Patient fasting, will get lab work today and call with results.  Patient successfully losing weight on Ozempic  with diet and exercise. Also decreasing smoking cigarettes.     No other concerns at this time.   Past Medical History:  Diagnosis Date   Anxiety    COVID-19     History reviewed. No pertinent surgical history.  Social History   Socioeconomic History   Marital status: Divorced    Spouse name: Not on file   Number of children: Not on file   Years of education: Not on file   Highest education level: Not on file  Occupational History   Not on file  Tobacco Use   Smoking status: Every Day    Types: Cigarettes   Smokeless tobacco: Never  Vaping Use   Vaping status: Never Used  Substance and Sexual Activity   Alcohol use: No   Drug use: No   Sexual activity: Not Currently    Birth control/protection: Surgical    Comment: BTL  Other Topics Concern   Not on file  Social History Narrative   Not on file   Social Drivers of Health   Financial Resource Strain: Not on file  Food Insecurity: No Food Insecurity (12/07/2022)   Hunger Vital Sign    Worried About Running Out of Food in the Last Year: Never true    Ran Out of Food in the Last Year: Never true  Transportation Needs: No Transportation Needs (12/07/2022)   PRAPARE - Administrator, Civil Service (Medical): No    Lack of Transportation (Non-Medical): No  Physical Activity: Not on file  Stress: Not on file  Social Connections: Not on file  Intimate Partner Violence: Not At Risk (12/07/2022)    Humiliation, Afraid, Rape, and Kick questionnaire    Fear of Current or Ex-Partner: No    Emotionally Abused: No    Physically Abused: No    Sexually Abused: No    Family History  Problem Relation Age of Onset   Heart attack Father    Diabetes Maternal Aunt    Diabetes Paternal Aunt    Stroke Paternal Grandmother    BRCA 1/2 Neg Hx    Breast cancer Neg Hx     No Known Allergies  Outpatient Medications Prior to Visit  Medication Sig   clobetasol ointment (TEMOVATE) 0.05 %    clonazePAM  (KLONOPIN ) 0.5 MG tablet Take 0.5 mg by mouth 2 (two) times daily as needed for anxiety.   cyclobenzaprine (FLEXERIL) 10 MG tablet Take 10 mg by mouth 2 (two) times daily as needed.   gabapentin  (NEURONTIN ) 600 MG tablet Take 600 mg by mouth 3 (three) times daily.   hydrocortisone  cream 1 % Apply to affected area 2 times daily   levothyroxine  (SYNTHROID ) 100 MCG tablet Take 1 tablet (100 mcg total) by mouth daily before breakfast.   meloxicam (MOBIC) 15 MG tablet Take 1 tablet by mouth daily.   metFORMIN (GLUCOPHAGE) 1000 MG tablet Take 500 mg by mouth 2 (two) times daily.   ondansetron  (ZOFRAN ) 4 MG  tablet Take 4 mg by mouth every 8 (eight) hours as needed.   valACYclovir  (VALTREX ) 1000 MG tablet Take 1 tablet (1,000 mg total) by mouth daily.   [DISCONTINUED] Semaglutide  (OZEMPIC , 1 MG/DOSE, [place]) Inject into the skin.   Norethindrone  Acetate-Ethinyl Estradiol (LOESTRIN) 1.5-30 MG-MCG tablet Take 1 tablet by mouth daily.   [DISCONTINUED] Semaglutide , 1 MG/DOSE, 4 MG/3ML SOPN Inject 1 mg into the skin once a week. (Patient not taking: Reported on 09/10/2023)   No facility-administered medications prior to visit.    Review of Systems  Constitutional: Negative.   HENT: Negative.    Eyes: Negative.   Respiratory: Negative.  Negative for shortness of breath.   Cardiovascular: Negative.  Negative for chest pain.  Gastrointestinal: Negative.  Negative for abdominal pain, constipation and diarrhea.   Genitourinary: Negative.   Musculoskeletal:  Negative for joint pain and myalgias.  Skin: Negative.   Neurological: Negative.  Negative for dizziness and headaches.  Endo/Heme/Allergies: Negative.   All other systems reviewed and are negative.      Objective:   BP 114/72 (BP Location: Right Arm, Patient Position: Sitting, Cuff Size: Large)   Pulse 89   Ht 5' 6 (1.676 m)   Wt 224 lb (101.6 kg)   LMP  (Approximate)   SpO2 98%   BMI 36.15 kg/m   Vitals:   09/10/23 0910 09/10/23 0931  BP: (!) 158/92 114/72  Pulse: 89   Height: 5' 6 (1.676 m)   Weight: 224 lb (101.6 kg)   SpO2: 98%   BMI (Calculated): 36.17     Physical Exam Vitals and nursing note reviewed.  Constitutional:      Appearance: Normal appearance. She is normal weight.  HENT:     Head: Normocephalic and atraumatic.     Nose: Nose normal.     Mouth/Throat:     Mouth: Mucous membranes are moist.  Eyes:     Extraocular Movements: Extraocular movements intact.     Conjunctiva/sclera: Conjunctivae normal.     Pupils: Pupils are equal, round, and reactive to light.  Cardiovascular:     Rate and Rhythm: Normal rate and regular rhythm.     Pulses: Normal pulses.     Heart sounds: Normal heart sounds.  Pulmonary:     Effort: Pulmonary effort is normal.     Breath sounds: Normal breath sounds.  Abdominal:     General: Abdomen is flat. Bowel sounds are normal.     Palpations: Abdomen is soft.  Musculoskeletal:        General: Normal range of motion.     Cervical back: Normal range of motion.  Skin:    General: Skin is warm and dry.  Neurological:     General: No focal deficit present.     Mental Status: She is alert and oriented to person, place, and time.  Psychiatric:        Mood and Affect: Mood normal.        Behavior: Behavior normal.        Thought Content: Thought content normal.        Judgment: Judgment normal.      No results found for any visits on 09/10/23.  Recent Results (from  the past 2160 hours)  Surgical pathology     Status: None   Collection Time: 07/16/23  9:10 AM  Result Value Ref Range   SURGICAL PATHOLOGY      SURGICAL PATHOLOGY CASE: [id number] PATIENT: Brittany Keller Surgical Pathology Report  Clinical History: menorrhagia with regular cycle, heavy 2 week periods every month (cm)     FINAL MICROSCOPIC DIAGNOSIS:  A. ENDOMETRIUM, BIOPSY: Benign early secretory phase endometrium, POD 2/3, showing focal squamous morular metaplasia Benign endocervical epithelium and endocervix showing squamous metaplasia with reactive atypia Negative for polyp, breakdown, atypia, hyperplasia and carcinoma   GROSS DESCRIPTION:  A. Received in formalin labeled with the patients name and DOB is a 2.3 x 2.0 x 0.3 cm aggregate of red-brown soft tissue fragments, submitted in toto in a single cassette(s).  (LEF 07/16/2023)  Final Diagnosis performed by Prentice Pitcher, MD.   Electronically signed 07/17/2023 Technical and / or Professional components performed at [facility]. [facility], [address] [place], [place] [address].  Immunohistochemistry Technical c omponent (if applicable) was performed at [facility]. [address], [address], [place], [place] [address].   IMMUNOHISTOCHEMISTRY DISCLAIMER (if applicable): Some of these immunohistochemical stains may have been developed and the performance characteristics determine by [facility]. Some may not have been cleared or approved by the U.S. Food and Drug Administration. The FDA has determined that such clearance or approval is not necessary. This test is used for clinical purposes. It should not be regarded as investigational or for research. This laboratory is certified under the Clinical Laboratory Improvement Amendments of 1988 (CLIA-88) as qualified to perform high complexity clinical laboratory testing.  The controls stained appropriately.    IHC stains are performed on formalin fixed, paraffin embedded tissue using a 3,3diaminobenzidine (DAB) chromogen and Leica Bond Autostainer System. The staining intensity of the nucleus is score man ually and is reported as the percentage of tumor cell nuclei demonstrating specific nuclear staining. The specimens are fixed in 10% Neutral Formalin for at least 6 hours and up to 72hrs. These tests are validated on decalcified tissue. Results should be interpreted with caution given the possibility of false negative results on decalcified specimens. Antibody Clones are as follows ER-clone 66F, PR-clone 16, Ki67- clone MM1. Some of these immunohistochemical stains may have been developed and the performance characteristics determined by [facility].   POCT Urinalysis Dipstick     Status: Normal   Collection Time: 07/30/23  2:43 PM  Result Value Ref Range   Color, UA     Clarity, UA     Glucose, UA Negative Negative   Bilirubin, UA Negative    Ketones, UA Negative    Spec Grav, UA 1.020 1.010 - 1.025   Blood, UA Positive    pH, UA 7.0 5.0 - 8.0   Protein, UA Negative Negative   Urobilinogen, UA 0.2 0.2 or 1.0 E.U./dL   Nitrite, UA Negative    Leukocytes, UA Negative Negative   Appearance     Odor    Cytology - PAP     Status: None   Collection Time: 08/12/23  3:40 PM  Result Value Ref Range   High risk HPV Negative    Adequacy      Satisfactory for evaluation; transformation zone component PRESENT.   Diagnosis      - Negative for intraepithelial lesion or malignancy (NILM)   Comment Normal Reference Range HPV - Negative   HSV 1 and 2 Ab, IgG     Status: Abnormal   Collection Time: 08/12/23  4:02 PM  Result Value Ref Range   HSV 1 Glycoprotein G Ab, IgG Non Reactive Non Reactive    Comment: **Please note reference interval change** HSV-1 IgG testing performed using the Roche  Elecsys HSV-1 IgG assay.    HSV 2 IgG, Type Spec Reactive (A) Non Reactive    Comment:  **Please note reference interval change** Current guidelines and recommendations do not recommend routine screening for HSV-2 in asymptomatic individuals, including those that are pregnant. The detection of HSV-2 IgG antibodies in a single sample indicates previous exposure to HSV-2 but does not give information as to the site of HSV infection or the timing of exposure. The predictive value of positive and negative results depends on the population's prevalence and the pretest likelihood of HSV-2. HSV-2 IgG testing performed using the Roche Elecsys HSV-2 IgG assay.   Urine Culture     Status: None   Collection Time: 08/12/23  4:03 PM   Specimen: Urine   UR  Result Value Ref Range   Urine Culture, Routine Final report    Organism ID, Bacteria Comment     Comment: Culture shows less than 10,000 colony forming units of bacteria per milliliter of urine. This colony count is not generally considered to be clinically significant.       Assessment & Plan:  Fasting lab work today Increase Ozempic  to 2 mg weekly  Problem List Items Addressed This Visit       Endocrine   Type 2 diabetes mellitus without complications (HCC) (Chronic)   Relevant Medications   Semaglutide , 2 MG/DOSE, (OZEMPIC , 2 MG/DOSE,) 8 MG/3ML SOPN   Other Relevant Orders   Hemoglobin A1c   Hypothyroidism - Primary   Relevant Orders   TSH     Other   Anemia   Relevant Orders   CBC with Differential/Platelet   Iron , TIBC and Ferritin Panel   Other Visit Diagnoses       Lipid screening       Relevant Orders   CMP14+EGFR   Lipid Profile       Return in about 4 months (around 01/11/2024) for fasting labs prior.   Total time spent: 25 minutes  [employer], NP  09/10/2023   This document may have been prepared by Dragon Voice Recognition software and as such may include unintentional dictation errors.

## 2023-09-11 ENCOUNTER — Ambulatory Visit: Payer: Self-pay | Admitting: Cardiology

## 2023-09-11 LAB — IRON,TIBC AND FERRITIN PANEL
Ferritin: 8 ng/mL — ABNORMAL LOW (ref 15–150)
Iron Saturation: 9 % — CL (ref 15–55)
Iron: 39 ug/dL (ref 27–159)
Total Iron Binding Capacity: 416 ug/dL (ref 250–450)
UIBC: 377 ug/dL (ref 131–425)

## 2023-09-11 LAB — CBC WITH DIFFERENTIAL/PLATELET
Basophils Absolute: 0.1 x10E3/uL (ref 0.0–0.2)
Basos: 1 %
EOS (ABSOLUTE): 0.3 x10E3/uL (ref 0.0–0.4)
Eos: 4 %
Hematocrit: 34.6 % (ref 34.0–46.6)
Hemoglobin: 11.2 g/dL (ref 11.1–15.9)
Immature Grans (Abs): 0 x10E3/uL (ref 0.0–0.1)
Immature Granulocytes: 0 %
Lymphocytes Absolute: 1.9 x10E3/uL (ref 0.7–3.1)
Lymphs: 20 %
MCH: 28.5 pg (ref 26.6–33.0)
MCHC: 32.4 g/dL (ref 31.5–35.7)
MCV: 88 fL (ref 79–97)
Monocytes Absolute: 0.5 x10E3/uL (ref 0.1–0.9)
Monocytes: 5 %
Neutrophils Absolute: 6.7 x10E3/uL (ref 1.4–7.0)
Neutrophils: 70 %
Platelets: 330 x10E3/uL (ref 150–450)
RBC: 3.93 x10E6/uL (ref 3.77–5.28)
RDW: 14.7 % (ref 11.7–15.4)
WBC: 9.5 x10E3/uL (ref 3.4–10.8)

## 2023-09-11 LAB — CMP14+EGFR
ALT: 18 IU/L (ref 0–32)
AST: 19 IU/L (ref 0–40)
Albumin: 4 g/dL (ref 3.9–4.9)
Alkaline Phosphatase: 49 IU/L (ref 44–121)
BUN/Creatinine Ratio: 16 (ref 9–23)
BUN: 12 mg/dL (ref 6–24)
Bilirubin Total: 0.2 mg/dL (ref 0.0–1.2)
CO2: 16 mmol/L — ABNORMAL LOW (ref 20–29)
Calcium: 9.3 mg/dL (ref 8.7–10.2)
Chloride: 104 mmol/L (ref 96–106)
Creatinine, Ser: 0.73 mg/dL (ref 0.57–1.00)
Globulin, Total: 2.8 g/dL (ref 1.5–4.5)
Glucose: 107 mg/dL — ABNORMAL HIGH (ref 70–99)
Potassium: 3.6 mmol/L (ref 3.5–5.2)
Sodium: 140 mmol/L (ref 134–144)
Total Protein: 6.8 g/dL (ref 6.0–8.5)
eGFR: 105 mL/min/1.73 (ref >59)

## 2023-09-11 LAB — LIPID PANEL
Chol/HDL Ratio: 4.6 ratio — ABNORMAL HIGH (ref 0.0–4.4)
Cholesterol, Total: 192 mg/dL (ref 100–199)
HDL: 42 mg/dL (ref >39)
LDL Chol Calc (NIH): 114 mg/dL — ABNORMAL HIGH (ref 0–99)
Triglycerides: 206 mg/dL — ABNORMAL HIGH (ref 0–149)
VLDL Cholesterol Cal: 36 mg/dL (ref 5–40)

## 2023-09-11 LAB — HEMOGLOBIN A1C
Est. average glucose Bld gHb Est-mCnc: 111 mg/dL
Hgb A1c MFr Bld: 5.5 % (ref 4.8–5.6)

## 2023-09-11 LAB — TSH: TSH: 1.95 u[IU]/mL (ref 0.450–4.500)

## 2023-09-11 NOTE — Progress Notes (Signed)
Pt informed

## 2023-11-27 ENCOUNTER — Encounter: Payer: Self-pay | Admitting: Internal Medicine

## 2023-12-04 ENCOUNTER — Inpatient Hospital Stay: Payer: Self-pay | Admitting: Oncology

## 2023-12-04 ENCOUNTER — Inpatient Hospital Stay: Payer: Self-pay

## 2024-01-09 ENCOUNTER — Encounter: Payer: Self-pay | Admitting: Internal Medicine

## 2024-01-16 ENCOUNTER — Ambulatory Visit: Admitting: Cardiology

## 2024-03-06 ENCOUNTER — Encounter: Payer: Self-pay | Admitting: Internal Medicine

## 2024-03-09 ENCOUNTER — Encounter: Payer: Self-pay | Admitting: Cardiology

## 2024-03-09 ENCOUNTER — Ambulatory Visit: Admitting: Cardiology

## 2024-03-09 VITALS — BP 128/78 | HR 84 | Ht 66.0 in | Wt 195.0 lb

## 2024-03-09 DIAGNOSIS — Z13220 Encounter for screening for lipoid disorders: Secondary | ICD-10-CM | POA: Diagnosis not present

## 2024-03-09 DIAGNOSIS — Z720 Tobacco use: Secondary | ICD-10-CM | POA: Diagnosis not present

## 2024-03-09 DIAGNOSIS — F17210 Nicotine dependence, cigarettes, uncomplicated: Secondary | ICD-10-CM

## 2024-03-09 DIAGNOSIS — E039 Hypothyroidism, unspecified: Secondary | ICD-10-CM

## 2024-03-09 DIAGNOSIS — D500 Iron deficiency anemia secondary to blood loss (chronic): Secondary | ICD-10-CM

## 2024-03-09 DIAGNOSIS — Z131 Encounter for screening for diabetes mellitus: Secondary | ICD-10-CM

## 2024-03-09 DIAGNOSIS — E1165 Type 2 diabetes mellitus with hyperglycemia: Secondary | ICD-10-CM | POA: Insufficient documentation

## 2024-03-09 DIAGNOSIS — Z0130 Encounter for examination of blood pressure without abnormal findings: Secondary | ICD-10-CM

## 2024-03-09 MED ORDER — OZEMPIC (2 MG/DOSE) 8 MG/3ML ~~LOC~~ SOPN: 2.0000 mg | PEN_INJECTOR | SUBCUTANEOUS | 12 refills | Status: AC

## 2024-03-09 MED ORDER — NICOTINE 14 MG/24HR TD PT24: 14.0000 mg | MEDICATED_PATCH | TRANSDERMAL | 0 refills | Status: AC

## 2024-03-09 MED ORDER — NICOTINE 21 MG/24HR TD PT24: 21.0000 mg | MEDICATED_PATCH | Freq: Every day | TRANSDERMAL | 0 refills | Status: AC

## 2024-03-09 MED ORDER — NICOTINE 7 MG/24HR TD PT24: 7.0000 mg | MEDICATED_PATCH | TRANSDERMAL | 0 refills | Status: AC

## 2024-03-09 NOTE — Progress Notes (Signed)
 "  Established Patient Office Visit  Subjective:  Patient ID: Brittany Keller, female    DOB: 02-19-1981  Age: 44 y.o. MRN: [id number]  Chief Complaint  Patient presents with   Follow-up    4 month lab results    Patient in office for 4 month follow up, discuss recent lab results. Patient doing well, no complaints today.  Patient was without insurance for a period of time, has insurance now. Was out of Ozempic , was using a friend's Mounjaro .  Due for fasting lab work , will do today.  Patient interested in quitting smoking, discussed options. Will send in Nicotine  patches.  Continue current medications.     No other concerns at this time.   Past Medical History:  Diagnosis Date   Anxiety    COVID-19     History reviewed. No pertinent surgical history.  Social History   Socioeconomic History   Marital status: Divorced    Spouse name: Not on file   Number of children: Not on file   Years of education: Not on file   Highest education level: Not on file  Occupational History   Not on file  Tobacco Use   Smoking status: Every Day    Types: Cigarettes   Smokeless tobacco: Never  Vaping Use   Vaping status: Never Used  Substance and Sexual Activity   Alcohol use: No   Drug use: No   Sexual activity: Not Currently    Birth control/protection: Surgical    Comment: BTL  Other Topics Concern   Not on file  Social History Narrative   Not on file   Social Drivers of Health   Tobacco Use: High Risk (03/09/2024)   Patient History    Smoking Tobacco Use: Every Day    Smokeless Tobacco Use: Never    Passive Exposure: Not on file  Financial Resource Strain: Not on file  Food Insecurity: No Food Insecurity (12/07/2022)   Hunger Vital Sign    Worried About Running Out of Food in the Last Year: Never true    Ran Out of Food in the Last Year: Never true  Transportation Needs: No Transportation Needs (12/07/2022)   PRAPARE - Administrator, Civil Service  (Medical): No    Lack of Transportation (Non-Medical): No  Physical Activity: Not on file  Stress: Not on file  Social Connections: Not on file  Intimate Partner Violence: Not At Risk (12/07/2022)   Humiliation, Afraid, Rape, and Kick questionnaire    Fear of Current or Ex-Partner: No    Emotionally Abused: No    Physically Abused: No    Sexually Abused: No  Depression (PHQ2-9): Medium Risk (12/27/2022)   Depression (PHQ2-9)    PHQ-2 Score: 5  Alcohol Screen: Not on file  Housing: Low Risk (12/07/2022)   Housing    Last Housing Risk Score: 0  Utilities: Not At Risk (12/07/2022)   AHC Utilities    Threatened with loss of utilities: No  Health Literacy: Not on file    Family History  Problem Relation Age of Onset   Heart attack Father    Diabetes Maternal Aunt    Diabetes Paternal Aunt    Stroke Paternal Grandmother    BRCA 1/2 Neg Hx    Breast cancer Neg Hx     Allergies[1]  Show/hide medication list[2]  Review of Systems  Constitutional: Negative.   HENT: Negative.    Eyes: Negative.   Respiratory: Negative.  Negative for shortness of breath.  Cardiovascular: Negative.  Negative for chest pain.  Gastrointestinal: Negative.  Negative for abdominal pain, constipation and diarrhea.  Genitourinary: Negative.   Musculoskeletal:  Negative for joint pain and myalgias.  Skin: Negative.   Neurological: Negative.  Negative for dizziness and headaches.  Endo/Heme/Allergies: Negative.   All other systems reviewed and are negative.      Objective:   BP 128/78   Pulse 84   Ht 5' 6 (1.676 m)   Wt 195 lb (88.5 kg)   SpO2 98%   BMI 31.47 kg/m   Vitals:   03/09/24 0858  BP: 128/78  Pulse: 84  Height: 5' 6 (1.676 m)  Weight: 195 lb (88.5 kg)  SpO2: 98%  BMI (Calculated): 31.49    Physical Exam Vitals and nursing note reviewed.  Constitutional:      Appearance: Normal appearance. She is normal weight.  HENT:     Head: Normocephalic and atraumatic.      Nose: Nose normal.     Mouth/Throat:     Mouth: Mucous membranes are moist.  Eyes:     Extraocular Movements: Extraocular movements intact.     Conjunctiva/sclera: Conjunctivae normal.     Pupils: Pupils are equal, round, and reactive to light.  Cardiovascular:     Rate and Rhythm: Normal rate and regular rhythm.     Pulses: Normal pulses.     Heart sounds: Normal heart sounds.  Pulmonary:     Effort: Pulmonary effort is normal.     Breath sounds: Normal breath sounds.  Abdominal:     General: Abdomen is flat. Bowel sounds are normal.     Palpations: Abdomen is soft.  Musculoskeletal:        General: Normal range of motion.     Cervical back: Normal range of motion.  Skin:    General: Skin is warm and dry.  Neurological:     General: No focal deficit present.     Mental Status: She is alert and oriented to person, place, and time.  Psychiatric:        Mood and Affect: Mood normal.        Behavior: Behavior normal.        Thought Content: Thought content normal.        Judgment: Judgment normal.      No results found for any visits on 03/09/24.  No results found for this or any previous visit (from the past 2160 hours).    Assessment & Plan:  Fasting lab work today Nicotine  patches to help quit smoking Continue current medications  Problem List Items Addressed This Visit       Endocrine   Hypothyroidism   Relevant Orders   TSH   Type 2 diabetes mellitus with hyperglycemia, without long-term current use of insulin  (HCC) - Primary   Relevant Medications   Semaglutide , 2 MG/DOSE, (OZEMPIC , 2 MG/DOSE,) 8 MG/3ML SOPN   Other Relevant Orders   CMP14+EGFR   Hemoglobin A1c     Other   Iron  deficiency anemia due to chronic blood loss   Relevant Orders   CBC with Differential/Platelet   Iron , TIBC and Ferritin Panel   Smoking trying to quit   Other Visit Diagnoses       Diabetes mellitus screening         Lipid screening       Relevant Orders   Lipid Profile        Return in about 4 months (around 07/07/2024) for fasting lab work prior.  Total time spent: 25 minutes. This time includes review of previous notes and results and patient face to face interaction during today's visit.    Jeoffrey Pollen, NP  03/09/2024   This document may have been prepared by [facility] Voice Recognition software and as such may include unintentional dictation errors.     [1] No Known Allergies [2]  Outpatient Medications Prior to Visit  Medication Sig   clobetasol ointment (TEMOVATE) 0.05 %    clonazePAM  (KLONOPIN ) 0.5 MG tablet Take 0.5 mg by mouth 2 (two) times daily as needed for anxiety.   cyclobenzaprine (FLEXERIL) 10 MG tablet Take 10 mg by mouth 2 (two) times daily as needed.   gabapentin  (NEURONTIN ) 600 MG tablet Take 600 mg by mouth 3 (three) times daily.   levothyroxine  (SYNTHROID ) 100 MCG tablet Take 1 tablet (100 mcg total) by mouth daily before breakfast.   meloxicam (MOBIC) 15 MG tablet Take 1 tablet by mouth daily.   metFORMIN (GLUCOPHAGE) 1000 MG tablet Take 500 mg by mouth 2 (two) times daily.   Norethindrone  Acetate-Ethinyl Estradiol (LOESTRIN) 1.5-30 MG-MCG tablet Take 1 tablet by mouth daily.   ondansetron  (ZOFRAN ) 4 MG tablet Take 4 mg by mouth every 8 (eight) hours as needed.   valACYclovir  (VALTREX ) 1000 MG tablet Take 1 tablet (1,000 mg total) by mouth daily.   [DISCONTINUED] Semaglutide , 2 MG/DOSE, (OZEMPIC , 2 MG/DOSE,) 8 MG/3ML SOPN Inject 2 mg into the skin once a week.   No facility-administered medications prior to visit.   "

## 2024-03-10 ENCOUNTER — Ambulatory Visit: Payer: Self-pay | Admitting: Cardiology

## 2024-03-10 LAB — CMP14+EGFR
ALT: 10 IU/L (ref 0–32)
AST: 14 IU/L (ref 0–40)
Albumin: 4.1 g/dL (ref 3.9–4.9)
Alkaline Phosphatase: 49 IU/L (ref 41–116)
BUN/Creatinine Ratio: 17 (ref 9–23)
BUN: 14 mg/dL (ref 6–24)
Bilirubin Total: 0.2 mg/dL (ref 0.0–1.2)
CO2: 21 mmol/L (ref 20–29)
Calcium: 9.7 mg/dL (ref 8.7–10.2)
Chloride: 105 mmol/L (ref 96–106)
Creatinine, Ser: 0.84 mg/dL (ref 0.57–1.00)
Globulin, Total: 2.7 g/dL (ref 1.5–4.5)
Glucose: 88 mg/dL (ref 70–99)
Potassium: 3.9 mmol/L (ref 3.5–5.2)
Sodium: 140 mmol/L (ref 134–144)
Total Protein: 6.8 g/dL (ref 6.0–8.5)
eGFR: 88 mL/min/1.73

## 2024-03-10 LAB — CBC WITH DIFFERENTIAL/PLATELET
Basophils Absolute: 0.1 x10E3/uL (ref 0.0–0.2)
Basos: 1 %
EOS (ABSOLUTE): 0.2 x10E3/uL (ref 0.0–0.4)
Eos: 2 %
Hematocrit: 40.2 % (ref 34.0–46.6)
Hemoglobin: 13.2 g/dL (ref 11.1–15.9)
Immature Grans (Abs): 0 x10E3/uL (ref 0.0–0.1)
Immature Granulocytes: 0 %
Lymphocytes Absolute: 1.9 x10E3/uL (ref 0.7–3.1)
Lymphs: 20 %
MCH: 31.8 pg (ref 26.6–33.0)
MCHC: 32.8 g/dL (ref 31.5–35.7)
MCV: 97 fL (ref 79–97)
Monocytes Absolute: 0.5 x10E3/uL (ref 0.1–0.9)
Monocytes: 5 %
Neutrophils Absolute: 6.7 x10E3/uL (ref 1.4–7.0)
Neutrophils: 72 %
Platelets: 306 x10E3/uL (ref 150–450)
RBC: 4.15 x10E6/uL (ref 3.77–5.28)
RDW: 13.2 % (ref 11.7–15.4)
WBC: 9.3 x10E3/uL (ref 3.4–10.8)

## 2024-03-10 LAB — LIPID PANEL
Chol/HDL Ratio: 4.8 ratio — ABNORMAL HIGH (ref 0.0–4.4)
Cholesterol, Total: 195 mg/dL (ref 100–199)
HDL: 41 mg/dL
LDL Chol Calc (NIH): 123 mg/dL — ABNORMAL HIGH (ref 0–99)
Triglycerides: 175 mg/dL — ABNORMAL HIGH (ref 0–149)
VLDL Cholesterol Cal: 31 mg/dL (ref 5–40)

## 2024-03-10 LAB — HEMOGLOBIN A1C
Est. average glucose Bld gHb Est-mCnc: 100 mg/dL
Hgb A1c MFr Bld: 5.1 % (ref 4.8–5.6)

## 2024-03-10 LAB — IRON,TIBC AND FERRITIN PANEL
Ferritin: 17 ng/mL (ref 15–150)
Iron Saturation: 26 % (ref 15–55)
Iron: 95 ug/dL (ref 27–159)
Total Iron Binding Capacity: 367 ug/dL (ref 250–450)
UIBC: 272 ug/dL (ref 131–425)

## 2024-03-10 LAB — TSH: TSH: 1.66 u[IU]/mL (ref 0.450–4.500)

## 2024-07-13 ENCOUNTER — Ambulatory Visit
